# Patient Record
Sex: Female | Born: 1975 | Race: Black or African American | Hispanic: No | Marital: Married | Smoking: Never smoker
Health system: Southern US, Community
[De-identification: ages and names within clinical notes are randomized; demographics above are authoritative.]

## PROBLEM LIST (undated history)

## (undated) DIAGNOSIS — L0390 Cellulitis, unspecified: Secondary | ICD-10-CM

## (undated) DIAGNOSIS — I10 Essential (primary) hypertension: Secondary | ICD-10-CM

## (undated) DIAGNOSIS — E119 Type 2 diabetes mellitus without complications: Secondary | ICD-10-CM

## (undated) HISTORY — PX: OOPHORECTOMY: SHX86

## (undated) HISTORY — DX: Essential (primary) hypertension: I10

## (undated) HISTORY — DX: Cellulitis, unspecified: L03.90

---

## 1999-06-04 ENCOUNTER — Emergency Department (HOSPITAL_COMMUNITY): Admission: EM | Admit: 1999-06-04 | Discharge: 1999-06-04 | Payer: Self-pay | Admitting: Emergency Medicine

## 2002-12-11 HISTORY — PX: TONSILLECTOMY: SUR1361

## 2007-12-24 ENCOUNTER — Ambulatory Visit: Payer: Self-pay | Admitting: Family Medicine

## 2008-01-02 ENCOUNTER — Emergency Department: Payer: Self-pay | Admitting: Emergency Medicine

## 2009-02-06 ENCOUNTER — Ambulatory Visit: Payer: Self-pay | Admitting: Internal Medicine

## 2009-07-19 ENCOUNTER — Ambulatory Visit: Payer: Self-pay

## 2009-08-11 ENCOUNTER — Ambulatory Visit: Payer: Self-pay

## 2012-07-12 ENCOUNTER — Emergency Department: Payer: Self-pay | Admitting: *Deleted

## 2012-07-13 LAB — CBC
HCT: 40.7 % (ref 35.0–47.0)
HGB: 13.9 g/dL (ref 12.0–16.0)
MCHC: 34.2 g/dL (ref 32.0–36.0)
Platelet: 221 10*3/uL (ref 150–440)
RBC: 4.8 10*6/uL (ref 3.80–5.20)
RDW: 13.6 % (ref 11.5–14.5)
WBC: 6.2 10*3/uL (ref 3.6–11.0)

## 2012-07-13 LAB — COMPREHENSIVE METABOLIC PANEL
Albumin: 4.3 g/dL (ref 3.4–5.0)
Calcium, Total: 8.3 mg/dL — ABNORMAL LOW (ref 8.5–10.1)
Co2: 26 mmol/L (ref 21–32)
Creatinine: 0.86 mg/dL (ref 0.60–1.30)
EGFR (African American): >60
EGFR (Non-African Amer.): >60
Potassium: 4.1 mmol/L (ref 3.5–5.1)
SGOT(AST): 37 U/L (ref 15–37)
SGPT (ALT): 36 U/L (ref 12–78)

## 2013-02-24 ENCOUNTER — Ambulatory Visit: Payer: Self-pay | Admitting: Family Medicine

## 2013-03-28 ENCOUNTER — Ambulatory Visit: Payer: Self-pay | Admitting: Family Medicine

## 2014-01-19 ENCOUNTER — Emergency Department: Payer: Self-pay | Admitting: Emergency Medicine

## 2014-01-19 LAB — CBC WITH DIFFERENTIAL/PLATELET
Basophil #: 0 10*3/uL (ref 0.0–0.1)
Basophil %: 0.3 %
Eosinophil #: 0 10*3/uL (ref 0.0–0.7)
Eosinophil %: 0.4 %
HCT: 43.4 % (ref 35.0–47.0)
HGB: 14 g/dL (ref 12.0–16.0)
LYMPHS ABS: 0.4 10*3/uL — AB (ref 1.0–3.6)
Lymphocyte %: 3.3 %
MCH: 27.7 pg (ref 26.0–34.0)
MCHC: 32.2 g/dL (ref 32.0–36.0)
MCV: 86 fL (ref 80–100)
Monocyte #: 0.8 x10 3/mm (ref 0.2–0.9)
Monocyte %: 5.9 %
Neutrophil #: 12.1 10*3/uL — ABNORMAL HIGH (ref 1.4–6.5)
Neutrophil %: 90.1 %
Platelet: 233 10*3/uL (ref 150–440)
RBC: 5.04 10*6/uL (ref 3.80–5.20)
RDW: 13.7 % (ref 11.5–14.5)
WBC: 13.4 10*3/uL — AB (ref 3.6–11.0)

## 2014-01-19 LAB — URINALYSIS, COMPLETE
BLOOD: NEGATIVE
Bilirubin,UR: NEGATIVE
Glucose,UR: NEGATIVE mg/dL (ref 0–75)
KETONE: NEGATIVE
Leukocyte Esterase: NEGATIVE
NITRITE: NEGATIVE
Ph: 5 (ref 4.5–8.0)
Specific Gravity: 1.027 (ref 1.003–1.030)

## 2014-01-19 LAB — BASIC METABOLIC PANEL
Anion Gap: 9 (ref 7–16)
BUN: 11 mg/dL (ref 7–18)
CALCIUM: 8.9 mg/dL (ref 8.5–10.1)
CHLORIDE: 104 mmol/L (ref 98–107)
CREATININE: 0.95 mg/dL (ref 0.60–1.30)
Co2: 24 mmol/L (ref 21–32)
EGFR (African American): >60
EGFR (Non-African Amer.): >60
Glucose: 124 mg/dL — ABNORMAL HIGH (ref 65–99)
Osmolality: 275 (ref 275–301)
POTASSIUM: 3.8 mmol/L (ref 3.5–5.1)
Sodium: 137 mmol/L (ref 136–145)

## 2014-01-20 LAB — CBC
HCT: 38.8 % (ref 35.0–47.0)
HGB: 13 g/dL (ref 12.0–16.0)
MCH: 28.9 pg (ref 26.0–34.0)
MCHC: 33.5 g/dL (ref 32.0–36.0)
MCV: 86 fL (ref 80–100)
Platelet: 180 10*3/uL (ref 150–440)
RBC: 4.5 10*6/uL (ref 3.80–5.20)
RDW: 13.5 % (ref 11.5–14.5)
WBC: 12.2 10*3/uL — AB (ref 3.6–11.0)

## 2014-01-20 LAB — COMPREHENSIVE METABOLIC PANEL
ALBUMIN: 3.8 g/dL (ref 3.4–5.0)
ANION GAP: 6 — AB (ref 7–16)
Alkaline Phosphatase: 58 U/L
BUN: 12 mg/dL (ref 7–18)
Bilirubin,Total: 0.9 mg/dL (ref 0.2–1.0)
CHLORIDE: 100 mmol/L (ref 98–107)
Calcium, Total: 8.8 mg/dL (ref 8.5–10.1)
Co2: 28 mmol/L (ref 21–32)
Creatinine: 1.03 mg/dL (ref 0.60–1.30)
EGFR (Non-African Amer.): >60
Glucose: 131 mg/dL — ABNORMAL HIGH (ref 65–99)
Osmolality: 270 (ref 275–301)
Potassium: 3.7 mmol/L (ref 3.5–5.1)
SGOT(AST): 63 U/L — ABNORMAL HIGH (ref 15–37)
SGPT (ALT): 72 U/L (ref 12–78)
Sodium: 134 mmol/L — ABNORMAL LOW (ref 136–145)
Total Protein: 7.8 g/dL (ref 6.4–8.2)

## 2014-01-21 ENCOUNTER — Inpatient Hospital Stay: Payer: Self-pay | Admitting: Internal Medicine

## 2014-01-22 LAB — CBC WITH DIFFERENTIAL/PLATELET
BASOS ABS: 0 10*3/uL (ref 0.0–0.1)
BASOS PCT: 0.3 %
Eosinophil #: 0.3 10*3/uL (ref 0.0–0.7)
Eosinophil %: 3.5 %
HCT: 37.7 % (ref 35.0–47.0)
HGB: 12.4 g/dL (ref 12.0–16.0)
LYMPHS PCT: 20 %
Lymphocyte #: 1.9 10*3/uL (ref 1.0–3.6)
MCH: 28.4 pg (ref 26.0–34.0)
MCHC: 32.8 g/dL (ref 32.0–36.0)
MCV: 87 fL (ref 80–100)
Monocyte #: 0.9 x10 3/mm (ref 0.2–0.9)
Monocyte %: 9.8 %
Neutrophil #: 6.4 10*3/uL (ref 1.4–6.5)
Neutrophil %: 66.4 %
Platelet: 202 10*3/uL (ref 150–440)
RBC: 4.36 10*6/uL (ref 3.80–5.20)
RDW: 13.6 % (ref 11.5–14.5)
WBC: 9.6 10*3/uL (ref 3.6–11.0)

## 2014-01-22 LAB — BASIC METABOLIC PANEL
ANION GAP: 6 — AB (ref 7–16)
BUN: 6 mg/dL — AB (ref 7–18)
CALCIUM: 9 mg/dL (ref 8.5–10.1)
Chloride: 101 mmol/L (ref 98–107)
Co2: 28 mmol/L (ref 21–32)
Creatinine: 0.73 mg/dL (ref 0.60–1.30)
EGFR (Non-African Amer.): >60
Glucose: 118 mg/dL — ABNORMAL HIGH (ref 65–99)
OSMOLALITY: 269 (ref 275–301)
POTASSIUM: 3.7 mmol/L (ref 3.5–5.1)
SODIUM: 135 mmol/L — AB (ref 136–145)

## 2014-01-23 LAB — VANCOMYCIN, TROUGH: Vancomycin, Trough: 7 ug/mL — ABNORMAL LOW (ref 10–20)

## 2014-01-26 LAB — CULTURE, BLOOD (SINGLE)

## 2014-01-28 ENCOUNTER — Encounter: Payer: Self-pay | Admitting: Cardiovascular Disease

## 2014-01-28 ENCOUNTER — Ambulatory Visit (INDEPENDENT_AMBULATORY_CARE_PROVIDER_SITE_OTHER): Payer: BC Managed Care – PPO | Admitting: Cardiovascular Disease

## 2014-01-28 ENCOUNTER — Encounter: Payer: BC Managed Care – PPO | Admitting: Cardiovascular Disease

## 2014-01-28 ENCOUNTER — Encounter (INDEPENDENT_AMBULATORY_CARE_PROVIDER_SITE_OTHER): Payer: Self-pay

## 2014-01-28 VITALS — BP 150/90 | HR 87 | Ht 65.0 in | Wt 318.0 lb

## 2014-01-28 DIAGNOSIS — R7309 Other abnormal glucose: Secondary | ICD-10-CM

## 2014-01-28 DIAGNOSIS — R7303 Prediabetes: Secondary | ICD-10-CM | POA: Insufficient documentation

## 2014-01-28 DIAGNOSIS — I5031 Acute diastolic (congestive) heart failure: Secondary | ICD-10-CM

## 2014-01-28 DIAGNOSIS — I10 Essential (primary) hypertension: Secondary | ICD-10-CM | POA: Insufficient documentation

## 2014-01-28 DIAGNOSIS — I509 Heart failure, unspecified: Secondary | ICD-10-CM

## 2014-01-28 DIAGNOSIS — L0390 Cellulitis, unspecified: Secondary | ICD-10-CM | POA: Insufficient documentation

## 2014-01-28 DIAGNOSIS — R0602 Shortness of breath: Secondary | ICD-10-CM

## 2014-01-28 DIAGNOSIS — L0291 Cutaneous abscess, unspecified: Secondary | ICD-10-CM

## 2014-01-28 NOTE — Assessment & Plan Note (Signed)
Oliver sugar measurements in the hospital were more than 118. Recommended that her husband closely monitor her sugars as he is a diabetic

## 2014-01-28 NOTE — Assessment & Plan Note (Addendum)
Blood pressure was mildly elevated today. Has been provided numbers over the past week that were well controlled. Suggested she closely monitor her blood pressure at home. LVH, moderate in intensity, noted on outside echocardiogram. Possibly from chronic hypertension.

## 2014-01-28 NOTE — Progress Notes (Signed)
Patient ID: Sophia Brooks, female    DOB: 07/04/1976, 38 y.o.   MRN: [id number]  HPI Comments: Sophia Brooks is a very pleasant 38 year old woman history of moderate LVH on echocardiogram January 2015, diastolic dysfunction, presented to the hospital 01/21/2014 with discharge on 01/23/2014 with acute cellulitis of the right lower extremity, acute diastolic CHF.  She reports that 10 days ago she had right leg swelling, chills, nausea. She was seen in the emergency room and was told that she had a viral syndrome. She had worsening leg swelling and was seen back in the emergency room the next day he and was admitted for the next 4 days, started on IV antibiotics for cellulitis. Over the course of her hospitalization, she reports that she had significant IV fluids including antibiotics and by the last day she had shortness of breath when supine wheezing. She was given Lasix for discharge and Lasix to take when necessary at home.  She has not had to take additional Lasix since then and overall feels well. She continues to have right leg swelling and significant pain in the posterior aspect of her lower extremity. Left leg with no swelling and no pain. Reports that her breathing is stable, she is able to lay supine without any significant difficulty.  EKG shows normal sinus rhythm with rate 87 beats per minute with nonspecific ST abnormality in the inferior leads Blood work in the hospital shows elevated glucose levels though uncertain if these were fasting, normal renal function Ultrasound of her leg ever 11 2015 showing no DVT All of her glucose levels in the hospital ranged between 118, 124, 131, up to 167   Outpatient Encounter Prescriptions as of 01/28/2014  Medication Sig  . albuterol (ACCUNEB) 1.25 MG/3ML nebulizer solution Take 1 ampule by nebulization every 6 (six) hours as needed for wheezing.  . Cephalexin 500 MG tablet Take 500 mg by mouth 4 (four) times daily.  . chlorpheniramine-HYDROcodone  (TUSSIONEX) 10-8 MG/5ML LQCR Take 5 mLs by mouth 2 (two) times daily.  . furosemide (LASIX) 20 MG tablet Take 20 mg by mouth daily as needed.  Marland Kitchen ibuprofen (ADVIL,MOTRIN) 800 MG tablet Take 800 mg by mouth every 6 (six) hours as needed.  . metoprolol succinate (TOPROL-XL) 50 MG 24 hr tablet Take 50 mg by mouth daily. Take with or immediately following a meal.  . [DISCONTINUED] fexofenadine (ALLEGRA) 60 MG tablet Take 60 mg by mouth 2 (two) times daily.     Review of Systems  Constitutional: Negative.   HENT: Negative.   Eyes: Negative.   Respiratory: Negative.   Cardiovascular: Negative.   Gastrointestinal: Negative.   Endocrine: Negative.   Musculoskeletal: Negative.   Skin: Negative.        Leg swelling and pain on the right  Allergic/Immunologic: Negative.   Neurological: Negative.   Hematological: Negative.   Psychiatric/Behavioral: Negative.   All other systems reviewed and are negative.   BP 150/90  Pulse 87  Ht 5\' 5"  (1.651 m)  Wt 318 lb (144.244 kg)  BMI 52.92 kg/m2  Physical Exam  Nursing note and vitals reviewed. Constitutional: She is oriented to person, place, and time. She appears well-developed and well-nourished.  Obese  HENT:  Head: Normocephalic.  Nose: Nose normal.  Mouth/Throat: Oropharynx is clear and moist.  Eyes: Conjunctivae are normal. Pupils are equal, round, and reactive to light.  Neck: Normal range of motion. Neck supple. No JVD present.  Cardiovascular: Normal rate, regular rhythm, S1 normal, S2 normal, normal heart  sounds and intact distal pulses.  Exam reveals no gallop and no friction rub.   No murmur heard. Pulmonary/Chest: Effort normal and breath sounds normal. No respiratory distress. She has no wheezes. She has no rales. She exhibits no tenderness.  Abdominal: Soft. Bowel sounds are normal. She exhibits no distension. There is no tenderness.  Musculoskeletal: Normal range of motion. She exhibits no edema and no tenderness.   Lymphadenopathy:    She has no cervical adenopathy.  Neurological: She is alert and oriented to person, place, and time. Coordination normal.  Skin: Skin is warm and dry. No rash noted. No erythema.  Right leg is tender to palpation particularly below the knee, region of erythematous swelling of the right calf, 2 areas of blister  Psychiatric: She has a normal mood and affect. Her behavior is normal. Judgment and thought content normal.    Assessment and Plan

## 2014-01-28 NOTE — Assessment & Plan Note (Signed)
Suspect her exacerbation of diastolic CHF was from significant IV fluids while she was recently in the hospital. Suggested she take Lasix one or 2 tabs as needed for weight gain, shortness of breath or leg swelling. Discussed LVH with her. Recommended strict blood pressure control

## 2014-01-28 NOTE — Assessment & Plan Note (Signed)
Shortness of breath improved. Recent symptoms of shortness of breath likely from IV fluids in the hospital. Improved with Lasix

## 2014-01-28 NOTE — Assessment & Plan Note (Signed)
Cellulitis appears to be improving. Bandage placed today remained stability of her weeping blisters

## 2014-01-28 NOTE — Patient Instructions (Signed)
You are doing well. No medication changes were made.  Take lasix one tor two pills as needed for shortness of breath, quick weight gain for fluid, leg swelling/ankle edema Take with potassium  Please call us if you have new issues that need to be addressed before your next appt.  Follow up in a  Year

## 2014-03-02 ENCOUNTER — Ambulatory Visit (INDEPENDENT_AMBULATORY_CARE_PROVIDER_SITE_OTHER): Payer: BC Managed Care – PPO | Admitting: Podiatry

## 2014-03-02 ENCOUNTER — Encounter: Payer: Self-pay | Admitting: Podiatry

## 2014-03-02 ENCOUNTER — Ambulatory Visit (INDEPENDENT_AMBULATORY_CARE_PROVIDER_SITE_OTHER): Payer: BC Managed Care – PPO

## 2014-03-02 VITALS — BP 74/49 | HR 75 | Resp 16 | Ht 66.0 in | Wt 316.0 lb

## 2014-03-02 DIAGNOSIS — M79673 Pain in unspecified foot: Secondary | ICD-10-CM

## 2014-03-02 DIAGNOSIS — L02619 Cutaneous abscess of unspecified foot: Secondary | ICD-10-CM

## 2014-03-02 DIAGNOSIS — M79609 Pain in unspecified limb: Secondary | ICD-10-CM

## 2014-03-02 DIAGNOSIS — L03039 Cellulitis of unspecified toe: Secondary | ICD-10-CM

## 2014-03-02 MED ORDER — CEPHALEXIN 500 MG PO CAPS: 500.0000 mg | ORAL_CAPSULE | Freq: Three times a day (TID) | ORAL | Status: DC

## 2014-03-02 NOTE — Progress Notes (Signed)
   Subjective:    Patient ID: Sophia Brooks, female    DOB: 20-Nov-1976, 38 y.o.   MRN: [id number]  HPI Comments: i have a blister on my pinki toe on my left foot. Ive had it for off and on since 2.7.15. The toe is painful.  i was putting on lotion and a piece of skin came off and it had an odor to it. i went to the hospital the week of 2.9.15 and i was treated for cellulitis. They treated me with IV antibiotics. Dr Pryor Curiagrandas said i needed foot powder for foot fungus. She gave a rx foot powder but the pharmacy never got it in so i used lamisil foot cream in between my 4th and 5th toe rt foot.   My big toenail on my left foot has a black mark on it. Its been there off and on for 8-9 months. i used the lamisil cream on it. i trim my toenails. i have slight pain at times that goes through the nail.  Foot Pain      Review of Systems  Musculoskeletal:       Calf pain  Skin:       Change in nails  All other systems reviewed and are negative.       Objective:   Physical Exam: I have reviewed her past mental history medications allergies surgeries and social history. Has recently had a bout of cellulitis and was hospitalized. Pulses are strongly palpable right lower extremity. With a small blister beneath the fifth digit of the right foot mild cellulitis extending around the blister. Radiographs do not demonstrate any type of osseous abnormalities.        Assessment & Plan:  Assessment: Abscess with cellulitis fifth digit right foot.  Plan: We performed an incision and drainage today after Betadine skin prep. The drainage was sampled for a culture and sensitivity. We discussed with her soaking on her regular basis. I also discussed with her antibiotic use which for which we prescribed Keflex 500 mg 3 times a day. I will followup with her in a couple of weeks should the sample come back and we need for by different antibiotic we will do so.

## 2014-03-09 ENCOUNTER — Encounter: Payer: Self-pay | Admitting: Podiatry

## 2014-03-18 ENCOUNTER — Ambulatory Visit (INDEPENDENT_AMBULATORY_CARE_PROVIDER_SITE_OTHER): Payer: BC Managed Care – PPO | Admitting: Podiatry

## 2014-03-18 ENCOUNTER — Encounter: Payer: Self-pay | Admitting: Podiatry

## 2014-03-18 VITALS — BP 140/87 | HR 79 | Resp 18

## 2014-03-18 DIAGNOSIS — L02619 Cutaneous abscess of unspecified foot: Secondary | ICD-10-CM

## 2014-03-18 DIAGNOSIS — L03039 Cellulitis of unspecified toe: Principal | ICD-10-CM

## 2014-03-18 NOTE — Progress Notes (Signed)
She presents today for followup of an abscess to the plantar aspect of the fifth digit of the right foot. There is no erythema edema saline is drainage or odor on physical exam today appears to be healing quite nicely whenever I asked her she been soaking she states she soak twice a saw her last and she still taking antibiotics which should be completed this point.  Assessment: Well-healing I&D plantar aspect fifth digit right foot.  Plan continue to soak for week and continue to finish her antibiotics.

## 2015-04-03 NOTE — Discharge Summary (Signed)
PATIENT NAME:  Sophia Brooks, Sophia Brooks MR#:  [id number] DATE OF BIRTH:  [birth year]  DATE OF ADMISSION:  01/21/2014 DATE OF DISCHARGE:  01/23/2014  DISCHARGE DIAGNOSES: 1.  Acute cellulitis of the right lower extremity, improving with antibiotics.  2.  Lower extremity swelling, possible acute diastolic heart failure, well compensated now.   SECONDARY DIAGNOSES: Hypertension and obesity.   CONSULTATIONS: None.   PROCEDURES AND RADIOLOGY: Right lower extremity Doppler on 11th of February showed no DVT.   Chest x-ray on 12th of February showed bronchitic changes. Right middle lobe atelectasis. No pneumonia.   MAJOR LABORATORY PANEL: Blood cultures x 2 were negative on 11th of February.   HISTORY AND SHORT HOSPITAL COURSE: The patient is a 39 year old female with the above-mentioned medical problems who was admitted for right lower extremity cellulitis, was started on IV antibiotic. Please see Dr. Rob Hickman dictated history and physical for further details. This was broadened with addition of vancomycin to get better coverage of skin organism. The patient was improving. She was found to have some lower extremity swelling, for which she was given a one-time dose of Lasix with some improvement and was recommended use of p.r.n. Lasix at home and outpatient cardiology followup for evaluation of possible diastolic heart failure considering her obesity. On 13th of February, her leg swelling was much reduced and she was feeling much better and was discharged home in stable condition. On the 13th of February, her leg swelling and redness was significantly improved and was feeling much better, was close to her baseline, was discharged home in stable condition.   PHYSICAL EXAMINATION: VITAL SIGNS: On the date of discharge were as follows: Temperature 98, heart rate 82 per minute, respirations 18 per minute, blood pressure 139/83 mmHg. She was saturating 93% on room air.  CARDIOVASCULAR: S1, S2 normal. No murmurs,  rubs or gallop.  LUNGS: Clear to auscultation bilaterally. No wheezing, rales, rhonchi or crepitation.  ABDOMEN: Soft, benign.  NEUROLOGIC: Nonfocal examination. SKIN: On the right lower extremity was not showing much erythema. No warmth. Minimal swelling.   All other physical examination remained at baseline.   DISCHARGE MEDICATIONS: 1.  Metoprolol 50 mg p.o. daily.  2.  Albuterol nebulizer 3 times a day as needed.  3.  Tussionex 5 mL twice a day.  4.  Ibuprofen 800 mg p.o. 3 times a day.  5.  Fexofenadine/pseudoephedrine 1 tablet twice a day.  6.  Lasix 20 mg p.o. daily as needed.  7.  Keflex 500 mg 4 times a day for 5 more days.   DISCHARGE DIET: Low-sodium.   DISCHARGE ACTIVITY: As tolerated.   DISCHARGE INSTRUCTIONS AND FOLLOWUP: The patient was instructed to follow up with her primary care physician, Dr. Rolm Gala, in 1 to 2 weeks. She will need followup with cardiology, Dr. Mariah Milling, in 2 to 4 weeks; pulmonary, Dr. Meredeth Ide, in 4 to 6 weeks; and [facility], Dr. Gwen Pounds, in 2 to 4 weeks for evaluation of possible sarcoidosis, as the patient's mother was really concerned considering mother's history of sarcoidosis. I have recommended both mother and patient that this is outpatient workup and they are in agreement, and she is being discharged home in stable condition.   TOTAL TIME DISCHARGING THIS PATIENT: 55 minutes.   ____________________________ Ellamae Sia. Sherryll Burger, MD vss:jcm D: 01/24/2014 11:39:45 ET T: 01/24/2014 13:30:27 ET JOB#: [id number]  cc: Damondre Pfeifle S. Sherryll Burger, MD, <Dictator> Letitia Caul, MD Antonieta Iba, MD Herbon E. Meredeth Ide, MD Dr. Gwen Pounds, [facility] Ewel Lona Kathie Rhodes [facility]  MD ELECTRONICALLY SIGNED 01/25/2014 17:15

## 2015-04-03 NOTE — H&P (Signed)
PATIENT NAME:  Sophia Brooks, Sophia Brooks MR#:  [id number] DATE OF BIRTH:  [birth year]  DATE OF ADMISSION:  01/21/2014  PRIMARY CARE PHYSICIAN:  Dr. Rolm Gala.  REFERRING PHYSICIAN:  Dr. Manson Passey.  CHIEF COMPLAINT:  Redness of the right lower extremity.   HISTORY OF PRESENT ILLNESS:  The patient is a 39 year old morbidly obese female with past medical history of hypertension, is presenting to the ER with a chief complaint of redness in the right lower extremity.  She is reporting that while applying moisturizer on her right lower extremity two days ago she noticed a small cut on the flexor aspect of the right fifth toe.  Eventually, she noticed redness in the right thigh area which was spreading to the right lower extremity.  Actually she came to the ER on Monday.  She was diagnosed with URI which was treated with Tussionex, ibuprofen and Allegra.  The patient spiked a temp of 101.9 degrees Fahrenheit yesterday.  Denies any shortness of breath or dyspnea.  As the patient's redness is getting worse and extended to the right lower extremity she has called ER yesterday and they advised her to come to the ER.  Right lower extremity venous Dopplers were ordered in the ER.  According to the preliminary report, there is no DVT.  The patient was given IV clindamycin and hospitalist team is called to admit the patient.  During my examination, the patient is resting comfortably.  Husband is at bedside.  Denies any similar complaints in the past.  No chest pain, shortness of breath.  No other complaints.  The patient denies any wounds or abrasions, tick bites or bug bites recently on the lower extremity.  PAST MEDICAL HISTORY:  Hypertension and obesity.   PAST SURGICAL HISTORY:  Tonsillectomy.   PSYCHOSOCIAL HISTORY:  Lives at home with husband.  No history of smoking, alcohol or illicit drug usage.   ALLERGIES:  SHE IS ALLERGIC TO SULFA, TESSALON PERLES.   HOME MEDICATIONS:  Metoprolol extended release 50 mg 1  tablet by mouth once daily, ibuprofen 800 mg by mouth 3 times a day, fexofenadine 1 tablet by mouth q. 12 hours, albuterol inhaler as needed for shortness of breath, Tussionex 5 mL by mouth every 12 hours.   FAMILY HISTORY:  Mother has history of hypertension.  REVIEW OF SYSTEMS:  CONSTITUTIONAL:  Denies fatigue, but she spiked a temperature of 101 degrees yesterday.  EYES:  Denies blurry vision, double vision.  EARS, NOSE, THROAT:  Denies epistaxis, discharge.  RESPIRATION:  Denies cough, COPD.  CARDIOVASCULAR:  No chest pain, palpitations.  GASTROINTESTINAL:  Denies nausea, vomiting, diarrhea.  GENITOURINARY:  No dysuria, hematuria.  GYNECOLOGIC AND BREAST:  Denies breast mass or vaginal discharge.  ENDOCRINE:  Denies polyuria, nocturia, thyroid problems.  Has history of hypertension.  HEMATOLOGIC AND LYMPHATIC:  No anemia, easy bruising, bleeding.  INTEGUMENTARY:  No acne, rash, lesions.  MUSCULOSKELETAL:  No joint pain in the neck and back.  Denies gout.  NEUROLOGIC:  No vertigo or ataxia.  PSYCHIATRIC:  No ADD, OCD.   PHYSICAL EXAMINATION: VITAL SIGNS:  Temperature 97.6, pulse 96, respirations 18, blood pressure 139/68, pulse ox 95% on room air.  GENERAL APPEARANCE:  Not under acute distress.  Morbidly obese.  HEENT:  Normocephalic, atraumatic.  Pupils are equally reacting to light and accommodation.  No scleral icterus.  No conjunctival injection.  No sinus tenderness.  No postnasal drip.  Oral cavity is intact.  Uvula is midline.  NECK:  Supple.  No  JVD.  No thyromegaly.  Trachea is midline.  Range of motion is intact.  LUNGS:  Clear to auscultation bilaterally.  No accessory muscle usage.  No anterior chest wall tenderness on palpation.  CARDIAC:  S1, S2 normal.  Regular rate and rhythm.  No murmurs.  GASTROINTESTINAL:  Soft.  Bowel sounds are positive in all four quadrants.  Nontender, nondistended.  No hepatosplenomegaly.  No masses felt.  NEUROLOGIC:  Awake, alert, oriented x  3.  Motor and sensory grossly intact.  Reflexes are 2+.  EXTREMITIES:  Right lower extremity is erythematous in the right medial aspect of the thigh and the streaks extending down the right lower extremity which is significantly erythematous, tender and edematous.  Homans sign is positive.  Left lower extremity is intact.  A small teeny skin break was noticed on the flexor aspect of the right fifth toe.  No discharge noticed.  Peripheral pulse are 2+.  MUSCULOSKELETAL:  No joint effusion, tenderness.  PSYCHIATRIC:  Normal mood and affect.   LABORATORY AND IMAGING STUDIES:  Right lower extremity ultrasound, preliminary report, no DVT.  Chem-8 is normal except glucose at 131.  LFTs are normal except AST which is elevated .  CBC is normal except WBC is elevated at 12.2.    ASSESSMENT AND PLAN:  A 39 year old obese female presenting to the ER with a chief complaint of redness of the right lower extremity.  1.  Acute cellulitis of the right lower extremity.  We will provider her IV Rocephin and monitor clinically.  2.  Hypertension.  We will resume her home medication metoprolol.  3.  History of obesity.  The patient will be benefited with a low fat diet and exercise.   4.  We will provide her gastrointestinal and deep vein thrombosis prophylaxis.   5.  The rounding physician to follow up on the official report of right lower extremity ultrasound in the a.m.   6.  CODE STATUS:  SHE IS FULL CODE.   Diagnosis and plan of care was discussed in detail with the patient and her husband at bedside.  They both verbalized understanding of the plan.   Total time spent on the admission is 45 minutes.      ____________________________ Ramonita LabAruna Riese Hellard, MD ag:ea D: 01/21/2014 04:31:00 ET T: 01/21/2014 05:02:43 ET JOB#: [id number]  cc: Ramonita LabAruna Oma Marzan, MD, <Dictator> Letitia CaulHeidi M. Grandis, MD Ramonita LabARUNA Flois Mctague MD ELECTRONICALLY SIGNED 02/05/2014 18:39

## 2017-07-02 ENCOUNTER — Ambulatory Visit: Payer: BLUE CROSS/BLUE SHIELD

## 2017-07-02 ENCOUNTER — Encounter: Payer: BC Managed Care – PPO | Admitting: Podiatry

## 2017-07-02 DIAGNOSIS — M722 Plantar fascial fibromatosis: Secondary | ICD-10-CM

## 2017-07-02 NOTE — Progress Notes (Signed)
This encounter was created in error - please disregard.

## 2017-07-25 ENCOUNTER — Ambulatory Visit: Payer: BLUE CROSS/BLUE SHIELD

## 2017-07-25 ENCOUNTER — Emergency Department: Payer: BLUE CROSS/BLUE SHIELD

## 2017-07-25 ENCOUNTER — Encounter: Payer: Self-pay | Admitting: *Deleted

## 2017-07-25 ENCOUNTER — Emergency Department
Admission: EM | Admit: 2017-07-25 | Discharge: 2017-07-25 | Disposition: A | Discharge status: Home / Self Care | Payer: BLUE CROSS/BLUE SHIELD | Attending: Student in an Organized Health Care Education/Training Program | Admitting: Student in an Organized Health Care Education/Training Program

## 2017-07-25 DIAGNOSIS — R0602 Shortness of breath: Secondary | ICD-10-CM

## 2017-07-25 DIAGNOSIS — R1031 Right lower quadrant pain: Secondary | ICD-10-CM | POA: Diagnosis not present

## 2017-07-25 DIAGNOSIS — I5042 Chronic combined systolic (congestive) and diastolic (congestive) heart failure: Secondary | ICD-10-CM | POA: Insufficient documentation

## 2017-07-25 DIAGNOSIS — R911 Solitary pulmonary nodule: Secondary | ICD-10-CM

## 2017-07-25 DIAGNOSIS — I10 Essential (primary) hypertension: Secondary | ICD-10-CM | POA: Insufficient documentation

## 2017-07-25 DIAGNOSIS — I110 Hypertensive heart disease with heart failure: Secondary | ICD-10-CM | POA: Diagnosis not present

## 2017-07-25 LAB — COMPREHENSIVE METABOLIC PANEL
ALT: 24 U/L (ref 14–54)
AST: 33 U/L (ref 15–41)
Albumin: 4.9 g/dL (ref 3.5–5.0)
Alkaline Phosphatase: 35 U/L — ABNORMAL LOW (ref 38–126)
Anion gap: 10 (ref 5–15)
BUN: 11 mg/dL (ref 6–20)
CHLORIDE: 104 mmol/L (ref 101–111)
CO2: 24 mmol/L (ref 22–32)
Calcium: 9.2 mg/dL (ref 8.9–10.3)
Creatinine, Ser: 0.7 mg/dL (ref 0.44–1.00)
Glucose, Bld: 130 mg/dL — ABNORMAL HIGH (ref 65–99)
POTASSIUM: 3.7 mmol/L (ref 3.5–5.1)
SODIUM: 138 mmol/L (ref 135–145)
Total Bilirubin: 1.6 mg/dL — ABNORMAL HIGH (ref 0.3–1.2)
Total Protein: 8 g/dL (ref 6.5–8.1)

## 2017-07-25 LAB — URINALYSIS, COMPLETE (UACMP) WITH MICROSCOPIC
BACTERIA UA: NONE SEEN
BILIRUBIN URINE: NEGATIVE
Glucose, UA: NEGATIVE mg/dL
KETONES UR: NEGATIVE mg/dL
LEUKOCYTES UA: NEGATIVE
Nitrite: NEGATIVE
PROTEIN: NEGATIVE mg/dL
SPECIFIC GRAVITY, URINE: 1.02 (ref 1.005–1.030)
pH: 5 (ref 5.0–8.0)

## 2017-07-25 LAB — FIBRIN DERIVATIVES D-DIMER (ARMC ONLY): Fibrin derivatives D-dimer (ARMC): 603.64 — ABNORMAL HIGH (ref 0.00–499.00)

## 2017-07-25 LAB — CBC
HEMATOCRIT: 36.7 % (ref 35.0–47.0)
HEMOGLOBIN: 12.4 g/dL (ref 12.0–16.0)
MCH: 28.2 pg (ref 26.0–34.0)
MCHC: 33.9 g/dL (ref 32.0–36.0)
MCV: 83.3 fL (ref 80.0–100.0)
Platelets: 249 10*3/uL (ref 150–440)
RBC: 4.41 MIL/uL (ref 3.80–5.20)
RDW: 14.1 % (ref 11.5–14.5)
WBC: 7.1 10*3/uL (ref 3.6–11.0)

## 2017-07-25 LAB — TROPONIN I: Troponin I: <0.03 ng/mL (ref <0.03)

## 2017-07-25 LAB — LIPASE, BLOOD: LIPASE: 23 U/L (ref 11–51)

## 2017-07-25 LAB — POCT PREGNANCY, URINE: Preg Test, Ur: NEGATIVE

## 2017-07-25 MED ORDER — IOPAMIDOL (ISOVUE-300) INJECTION 61%
150.0000 mL | Freq: Once | INTRAVENOUS | Status: AC | PRN
  Administered 2017-07-25: 150 mL via INTRAVENOUS

## 2017-07-25 MED ORDER — SODIUM CHLORIDE 0.9 % IV BOLUS (SEPSIS)
1000.0000 mL | Freq: Once | INTRAVENOUS | Status: AC
  Administered 2017-07-25: 1000 mL via INTRAVENOUS

## 2017-07-25 MED ORDER — IPRATROPIUM-ALBUTEROL 0.5-2.5 (3) MG/3ML IN SOLN
3.0000 mL | Freq: Once | RESPIRATORY_TRACT | Status: AC
  Administered 2017-07-25: 3 mL via RESPIRATORY_TRACT
  Filled 2017-07-25: qty 3

## 2017-07-25 MED ORDER — PROMETHAZINE HCL 12.5 MG PO TABS: 12.5000 mg | ORAL_TABLET | Freq: Four times a day (QID) | ORAL | 0 refills | Status: DC | PRN

## 2017-07-25 MED ORDER — HYDROCODONE-ACETAMINOPHEN 5-325 MG PO TABS
2.0000 | ORAL_TABLET | Freq: Once | ORAL | Status: AC
  Administered 2017-07-25: 2 via ORAL
  Filled 2017-07-25: qty 2

## 2017-07-25 MED ORDER — MORPHINE SULFATE (PF) 4 MG/ML IV SOLN
4.0000 mg | INTRAVENOUS | Status: DC | PRN
  Administered 2017-07-25: 4 mg via INTRAVENOUS
  Filled 2017-07-25: qty 1

## 2017-07-25 MED ORDER — LABETALOL HCL 5 MG/ML IV SOLN
5.0000 mg | Freq: Once | INTRAVENOUS | Status: AC
  Administered 2017-07-25: 5 mg via INTRAVENOUS
  Filled 2017-07-25: qty 4

## 2017-07-25 MED ORDER — HYDROCODONE-ACETAMINOPHEN 5-325 MG PO TABS: 1.0000 | ORAL_TABLET | ORAL | 0 refills | Status: DC | PRN

## 2017-07-25 MED ORDER — PROMETHAZINE HCL 25 MG/ML IJ SOLN
12.5000 mg | Freq: Four times a day (QID) | INTRAMUSCULAR | Status: DC | PRN
  Administered 2017-07-25: 12.5 mg via INTRAVENOUS
  Filled 2017-07-25: qty 1

## 2017-07-25 MED ORDER — IOPAMIDOL (ISOVUE-370) INJECTION 76%
75.0000 mL | Freq: Once | INTRAVENOUS | Status: AC | PRN
  Administered 2017-07-25: 75 mL via INTRAVENOUS

## 2017-07-25 NOTE — ED Notes (Signed)
NM tech at bedside to explain VQ scan. Pt states she can not lay flat for the scan due to shortness of breath and will not be able to do the test. Dr. Pershing ProudSchaevitz notified.

## 2017-07-25 NOTE — ED Provider Notes (Signed)
[facility] Emergency Department Provider Note    First MD Initiated Contact with Patient 07/25/17 1141     (approximate)  I have reviewed the triage vital signs and the nursing notes.   HISTORY  Chief Complaint Abdominal Pain and Shortness of Breath    HPI Sophia Brooks is a 41 y.o. female is a chief complaint of 2 days of what initially was right lower quadrant abdominal pain now in the epigastric area associated with pressure. But denies any history of constipation. No fevers. Has felt nauseated but no vomiting. States that she felt SOB this am but the shortness of breath did not start until after having worsening abdominal pain. Denies any cough. No flank pain. No dysuria. No vaginal discharge. No recent abdominal surgeries.NO chest pain, diaphoresis, or exertional symptoms.    Past Medical History:  Diagnosis Date  . Cellulitis    acute cellulitis of the right lower extremity  . Hypertension    Family History  Problem Relation Age of Onset  . Hypertension Mother   . Heart attack Father 25  . Heart disease Father        3 stents placed LCA  . Hypertension Sister   . Heart attack Paternal Grandmother 52   Past Surgical History:  Procedure Laterality Date  . TONSILLECTOMY  2004   Patient Active Problem List   Diagnosis Date Noted  . HTN (hypertension) 01/28/2014  . Borderline diabetes 01/28/2014  . Cellulitis 01/28/2014  . Acute diastolic CHF (congestive heart failure) (HCC) 01/28/2014  . Shortness of breath 01/28/2014      Prior to Admission medications   Medication Sig Start Date End Date Taking? Authorizing Provider  meloxicam (MOBIC) 15 MG tablet Take 15 mg by mouth daily.   Yes [provider]  albuterol (ACCUNEB) 1.25 MG/3ML nebulizer solution Take 1 ampule by nebulization every 6 (six) hours as needed for wheezing.    [provider]  cephALEXin (KEFLEX) 500 MG capsule Take 1 capsule (500 mg total) by  mouth 3 (three) times daily. 03/02/14   Hyatt, Max T, DPM  furosemide (LASIX) 20 MG tablet Take 20 mg by mouth daily as needed.    [provider]  HYDROcodone-acetaminophen (NORCO) 5-325 MG tablet Take 1 tablet by mouth every 4 (four) hours as needed for moderate pain. 07/25/17   Willy Eddy, MD  metoprolol succinate (TOPROL-XL) 50 MG 24 hr tablet Take 50 mg by mouth daily. Take with or immediately following a meal.    [provider]  promethazine (PHENERGAN) 12.5 MG tablet Take 1 tablet (12.5 mg total) by mouth every 6 (six) hours as needed for nausea or vomiting. 07/25/17   Willy Eddy, MD    Allergies Sulfa antibiotics and Tessalon [benzonatate]    Social History Social History  Substance Use Topics  . Smoking status: Never Smoker  . Smokeless tobacco: Never Used  . Alcohol use No    Review of Systems Patient denies headaches, rhinorrhea, blurry vision, numbness, shortness of breath, chest pain, edema, cough, abdominal pain, nausea, vomiting, diarrhea, dysuria, fevers, rashes or hallucinations unless otherwise stated above in HPI. ____________________________________________   PHYSICAL EXAM:  VITAL SIGNS: Vitals:   07/25/17 1304 07/25/17 1620  BP: (!) 187/103 (!) 187/104  Pulse:  98  Resp:    Temp:    SpO2:  97%    Constitutional: Alert and oriented. Well appearing and in no acute distress. Eyes: Conjunctivae are normal.  Head: Atraumatic. Nose: No congestion/rhinnorhea. Mouth/Throat:  Mucous membranes are moist.   Neck: No stridor. Painless ROM.  Cardiovascular: Normal rate, regular rhythm. Grossly normal heart sounds.  Good peripheral circulation. Respiratory: Normal respiratory effort.  No retractions. Lungs CTAB. Gastrointestinal: Soft with mild epigastric, ruq and rlq ttp.  No guarding or rebound. No distention. No abdominal bruits. No CVA tenderness. Genitourinary:  Musculoskeletal: No lower extremity tenderness nor edema.  No joint  effusions. Neurologic:  Normal speech and language. No gross focal neurologic deficits are appreciated. No facial droop Skin:  Skin is warm, dry and intact. No rash noted. Psychiatric: Mood and affect are normal. Speech and behavior are normal.  ____________________________________________   LABS (all labs ordered are listed, but only abnormal results are displayed)  Results for orders placed or performed during the hospital encounter of 07/25/17 (from the past 24 hour(s))  Lipase, blood     Status: None   Collection Time: 07/25/17 11:33 AM  Result Value Ref Range   Lipase 23 11 - 51 U/L  Comprehensive metabolic panel     Status: Abnormal   Collection Time: 07/25/17 11:33 AM  Result Value Ref Range   Sodium 138 135 - 145 mmol/L   Potassium 3.7 3.5 - 5.1 mmol/L   Chloride 104 101 - 111 mmol/L   CO2 24 22 - 32 mmol/L   Glucose, Bld 130 (H) 65 - 99 mg/dL   BUN 11 6 - 20 mg/dL   Creatinine, Ser 1.61 0.44 - 1.00 mg/dL   Calcium 9.2 8.9 - 09.6 mg/dL   Total Protein 8.0 6.5 - 8.1 g/dL   Albumin 4.9 3.5 - 5.0 g/dL   AST 33 15 - 41 U/L   ALT 24 14 - 54 U/L   Alkaline Phosphatase 35 (L) 38 - 126 U/L   Total Bilirubin 1.6 (H) 0.3 - 1.2 mg/dL   GFR calc non Af Amer >60 >60 mL/min   GFR calc Af Amer >60 >60 mL/min   Anion gap 10 5 - 15  CBC     Status: None   Collection Time: 07/25/17 11:33 AM  Result Value Ref Range   WBC 7.1 3.6 - 11.0 K/uL   RBC 4.41 3.80 - 5.20 MIL/uL   Hemoglobin 12.4 12.0 - 16.0 g/dL   HCT 04.5 40.9 - 81.1 %   MCV 83.3 80.0 - 100.0 fL   MCH 28.2 26.0 - 34.0 pg   MCHC 33.9 32.0 - 36.0 g/dL   RDW 91.4 78.2 - 95.6 %   Platelets 249 150 - 440 K/uL  Troponin I     Status: None   Collection Time: 07/25/17 11:33 AM  Result Value Ref Range   Troponin I <0.03 <0.03 ng/mL  Urinalysis, Complete w Microscopic     Status: Abnormal   Collection Time: 07/25/17  1:05 PM  Result Value Ref Range   Color, Urine YELLOW (A) YELLOW   APPearance HAZY (A) CLEAR   Specific  Gravity, Urine 1.020 1.005 - 1.030   pH 5.0 5.0 - 8.0   Glucose, UA NEGATIVE NEGATIVE mg/dL   Hgb urine dipstick SMALL (A) NEGATIVE   Bilirubin Urine NEGATIVE NEGATIVE   Ketones, ur NEGATIVE NEGATIVE mg/dL   Protein, ur NEGATIVE NEGATIVE mg/dL   Nitrite NEGATIVE NEGATIVE   Leukocytes, UA NEGATIVE NEGATIVE   RBC / HPF 0-5 0 - 5 RBC/hpf   WBC, UA 0-5 0 - 5 WBC/hpf   Bacteria, UA NONE SEEN NONE SEEN   Squamous Epithelial / LPF 0-5 (A) NONE SEEN   Mucous  PRESENT    Hyaline Casts, UA PRESENT   Pregnancy, urine POC     Status: None   Collection Time: 07/25/17  1:09 PM  Result Value Ref Range   Preg Test, Ur NEGATIVE NEGATIVE   ____________________________________________  EKG My review and personal interpretation at Time: 11:27   Indication: sob  Rate: 100  Rhythm: sinus Axis: normal Other: minimal ST depression II and V6, no STEMI, normal intervals  My review and personal interpretation at Time: 16:20   Indication: sob  Rate: 95  Rhythm: sinus Axis: normal Other: minimal ST depression II and V6, no STEMI, normal intervals, no dynamic changes as compared to previous ____________________________________________  RADIOLOGY  I personally reviewed all radiographic images ordered to evaluate for the above acute complaints and reviewed radiology reports and findings.  These findings were personally discussed with the patient.  Please see medical record for radiology report.  ____________________________________________   PROCEDURES  Procedure(s) performed:  Procedures    Critical Care performed: no ____________________________________________   INITIAL IMPRESSION / ASSESSMENT AND PLAN / ED COURSE  Pertinent labs & imaging results that were available during my care of the patient were reviewed by me and considered in my medical decision making (see chart for details).  DDX: appy, cyst, diverticulitis, colitis, abscess, toa, mass, obstruction, pna, bronchitis, pe  Leonor LivMartarash  M Stash is a 41 y.o. who presents to the ED with Abdominal pain as described above. Patient also complaining of shortness of breath. Will order CT abd for the above differential  No chest pain. Has nonspecific ST changes but her troponin is negative.  Heart score of 3. Will further risk stratify with repeat troponin. Patient is low risk by wells criteria.    The patient will be placed on continuous pulse oximetry and telemetry for monitoring.  Laboratory evaluation will be sent to evaluate for the above complaints.     Clinical Course as of Jul 26 1627  Wed Jul 25, 2017  1407 Results of CT imaging discussed with patient. We'll order a transvaginal ultrasound to further evaluate for ovarian pathology.  Based on her lack of a leukocytosis or fever feel this is less consistent with SBP or acute infectious process. Less consistent with diverticulitis or perforation.  [PR]    Clinical Course User Index [PR] Willy Eddyobinson, Debroah Shuttleworth, MD   Based on CT imaging with an ovarian mass it occurred mid the patient's heart rate on EKG was greater than 100 and was therefore not PERC negative.  D-dimer will be sent to further risk stratify as well as VQ scan.  Remains chest pain free at this time.  Remains hemodynamically stable.  Patient will be signed out to Dr. Pershing ProudSchaevitz pending reassessment.  ____________________________________________   FINAL CLINICAL IMPRESSION(S) / ED DIAGNOSES  Final diagnoses:  RLQ abdominal pain  Shortness of breath      NEW MEDICATIONS STARTED DURING THIS VISIT:  New Prescriptions   HYDROCODONE-ACETAMINOPHEN (NORCO) 5-325 MG TABLET    Take 1 tablet by mouth every 4 (four) hours as needed for moderate pain.   PROMETHAZINE (PHENERGAN) 12.5 MG TABLET    Take 1 tablet (12.5 mg total) by mouth every 6 (six) hours as needed for nausea or vomiting.     Note:  This document was prepared using Dragon voice recognition software and may include unintentional dictation errors.      Willy Eddyobinson, Aladdin Kollmann, MD 07/25/17 [phone]

## 2017-07-25 NOTE — Progress Notes (Signed)
While rounding unit CH visited pt. Pt was lying in bed and a family member at bedside. CH talked briefly to pt and family member about how the pt was doing. Pt stated she was not feeling well CH empathized pt's pain, provided a ministry of presence, and [facility].   07/25/17 1300  Clinical Encounter Type  Visited With Patient and family together  Visit Type Initial;Other (Comment)  Referral From Chaplain  Spiritual Encounters  Spiritual Needs Prayer;Other (Comment)

## 2017-07-25 NOTE — ED Notes (Signed)
Patient returned from CT

## 2017-07-25 NOTE — ED Notes (Signed)
Patient transported to Ultrasound 

## 2017-07-25 NOTE — ED Notes (Signed)
Pt returned from US

## 2017-07-25 NOTE — ED Provider Notes (Signed)
Signout from Dr. Roxan Hockeyobinson in this 41 year old patient with a new pelvic mass. Also with shortness of breath with suspected PE. Physical Exam  BP (!) 165/96   Pulse 86   Temp 98.8 F (37.1 C) (Oral)   Resp (!) 25   Ht 5\' 6"  (1.676 m)   Wt (!) 145 kg (319 lb 10.7 oz)   LMP 06/13/2017   SpO2 95%   BMI 51.60 kg/m   Physical Exam  ED Course  Procedures  MDM Patient unable to undergo a VQ scan because unable to lie flat. I discussed the case with the CT tech who consulted radiology and they'll be able to do a CT angiography.  ----------------------------------------- 8:07 PM on 07/25/2017 -----------------------------------------  No PE on the CT angiography. However, possible lung metastases. I discussed this result with the patient. She'll be following up with OB/GYN. She is understanding of the diagnosis as well as the treatment plan and will follow up as soon as possible in office. She is understanding when to comply.       Sophia Brooks, Sophia Espinal Matthew, MD 07/25/17 2007

## 2017-07-25 NOTE — ED Notes (Signed)
Patient transported to CT 

## 2017-07-25 NOTE — ED Triage Notes (Signed)
Pt presents to ED reporting generalized abd pain since yesterday. Pain is described as a pressure. Pt denies constipation but verbalized nausea be ginning today. Pt also reports today pain began in her chest and she has had a SOB when standing and ambulating. No cough. No fevers. Pt denies dizziness or lightheadedness.

## 2017-07-25 NOTE — Discharge Instructions (Addendum)
°  You have been seen in the emergency department for emergency care. It is important that you contact your own doctor, specialist or the closest clinic for follow-up care. Please bring this instruction sheet, all medications and X-ray copies with you when you are seen for follow-up care.  Determining the exact cause for all patients with abdominal pain is extremely difficult in the emergency department. Our primary focus is to rule-out immediate life-threatening diseases. If no immediate source of pain is found the definitive diagnosis frequently needs to be determined over time.Many times your primary care physician can determine the cause by following the symptoms over time. Sometimes, specialist are required such as Gastroenterologists, Gynecologists, Urologists or Surgeons. Please return immediately to the Emergency Department for fever>101, Vomiting or Intractable Pain. You should return to the emergency department or see your primary care provider in 12-24hrs if your pain is no better and sooner if your pain becomes worse.    FINDINGS: Lower chest: Scattered nodules are noted throughout the lung bases. The largest of these measures approximately 7.5 mm.   Hepatobiliary: The liver is diffusely decreased in attenuation consistent with fatty infiltration. No ductal dilatation or focal mass is seen. The gallbladder is well distended and within normal limits.   Pancreas: Unremarkable. No pancreatic ductal dilatation or surrounding inflammatory changes.   Spleen: Normal in size without focal abnormality.   Adrenals/Urinary Tract: The adrenal glands are within normal limits. Kidneys show no renal calculi or obstructive change. The bladder is partially distended.   Stomach/Bowel: The appendix is well visualized and within normal limits. No obstructive or inflammatory changes in the bowel are seen.   Vascular/Lymphatic: Aortic atherosclerosis. No enlarged abdominal or pelvic lymph nodes.   Reproductive: Uterus is well visualized and within normal limits. Some cystic changes are noted within the left ovary. The right ovary appears enlarged with a partially solid appearance. The ovary measures 4.6 cm. This raises suspicion for ovarian mass.   Other: Mild ascites is noted within the abdomen. No definitive peritoneal implants are seen. This also contributes to the suggestion of ovarian neoplasm   Musculoskeletal: Mild degenerative change of the lumbar spine is seen.   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 Ultrasound of the pelvis may be helpful for further definition of the right ovarian changes. Contrast enhanced CT of the chest when able may also be helpful.

## 2017-07-26 DIAGNOSIS — N83209 Unspecified ovarian cyst, unspecified side: Secondary | ICD-10-CM | POA: Insufficient documentation

## 2017-07-26 DIAGNOSIS — Z6841 Body Mass Index (BMI) 40.0 and over, adult: Secondary | ICD-10-CM | POA: Insufficient documentation

## 2017-07-26 LAB — CA 125: CANCER ANTIGEN (CA) 125: 11.2 U/mL (ref 0.0–38.1)

## 2017-12-16 ENCOUNTER — Ambulatory Visit
Admission: EM | Admit: 2017-12-16 | Discharge: 2017-12-16 | Disposition: A | Discharge status: Home / Self Care | Payer: BLUE CROSS/BLUE SHIELD | Attending: Family Medicine | Admitting: Family Medicine

## 2017-12-16 ENCOUNTER — Encounter: Payer: Self-pay | Admitting: *Deleted

## 2017-12-16 ENCOUNTER — Other Ambulatory Visit: Payer: Self-pay

## 2017-12-16 DIAGNOSIS — R05 Cough: Secondary | ICD-10-CM | POA: Diagnosis not present

## 2017-12-16 DIAGNOSIS — J209 Acute bronchitis, unspecified: Secondary | ICD-10-CM | POA: Diagnosis not present

## 2017-12-16 MED ORDER — HYDROCOD POLST-CPM POLST ER 10-8 MG/5ML PO SUER: 5.0000 mL | Freq: Two times a day (BID) | ORAL | 0 refills | Status: DC | PRN

## 2017-12-16 MED ORDER — PREDNISONE 50 MG PO TABS: ORAL_TABLET | ORAL | 0 refills | Status: DC

## 2017-12-16 MED ORDER — DOXYCYCLINE HYCLATE 100 MG PO CAPS: 100.0000 mg | ORAL_CAPSULE | Freq: Two times a day (BID) | ORAL | 0 refills | Status: DC

## 2017-12-16 NOTE — ED Provider Notes (Signed)
[facility]  CSN: [id number] Arrival date & time: 12/16/17  1519  History   Chief Complaint Chief Complaint  Patient presents with  . Cough  . Nasal Congestion   HPI  42 year old female who is morbidly obese presents with cough and congestion.  Started on Thursday.  She has been experiencing worsening cough particularly at night. Severe. Cough is productive of yellow, brown sputum.  Been using Coricidin without improvement.  No known exacerbating factors.  She reports associated congestion.  No other associated symptoms.  No other complaints at this time.  Past Medical History:  Diagnosis Date  . Cellulitis    acute cellulitis of the right lower extremity  . Hypertension    Patient Active Problem List   Diagnosis Date Noted  . HTN (hypertension) 01/28/2014  . Borderline diabetes 01/28/2014  . Cellulitis 01/28/2014  . Acute diastolic CHF (congestive heart failure) (HCC) 01/28/2014  . Shortness of breath 01/28/2014    Past Surgical History:  Procedure Laterality Date  . TONSILLECTOMY  2004    OB History    No data available       Home Medications    Prior to Admission medications   Medication Sig Start Date End Date Taking? Authorizing Provider  albuterol (ACCUNEB) 1.25 MG/3ML nebulizer solution Take 1 ampule by nebulization every 6 (six) hours as needed for wheezing.   Yes [provider]  metoprolol succinate (TOPROL-XL) 50 MG 24 hr tablet Take 50 mg by mouth daily. Take with or immediately following a meal.   Yes [provider]  chlorpheniramine-HYDROcodone (TUSSIONEX PENNKINETIC ER) 10-8 MG/5ML SUER Take 5 mLs by mouth every 12 (twelve) hours as needed. 12/16/17   Tommie Sams, DO  doxycycline (VIBRAMYCIN) 100 MG capsule Take 1 capsule (100 mg total) by mouth 2 (two) times daily. 12/16/17   Tommie Sams, DO  predniSONE (DELTASONE) 50 MG tablet 1 tablet daily x 5 days. 12/16/17   Tommie Sams, DO    Family History Family History    Problem Relation Age of Onset  . Hypertension Mother   . Heart attack Father 33  . Heart disease Father        3 stents placed LCA  . Hypertension Sister   . Heart attack Paternal Grandmother 89    Social History Social History   Tobacco Use  . Smoking status: Never Smoker  . Smokeless tobacco: Never Used  Substance Use Topics  . Alcohol use: No  . Drug use: No     Allergies   Sulfa antibiotics and Tessalon [benzonatate]   Review of Systems Review of Systems  Constitutional: Negative for fever.  HENT: Positive for congestion.   Respiratory: Positive for cough.   All other systems reviewed and are negative.  Physical Exam Triage Vital Signs ED Triage Vitals  Enc Vitals Group     BP 12/16/17 1547 (!) 181/93     Pulse Rate 12/16/17 1547 90     Resp 12/16/17 1547 16     Temp 12/16/17 1547 98.7 F (37.1 C)     Temp Source 12/16/17 1547 Oral     SpO2 12/16/17 1547 96 %     Weight 12/16/17 1547 (!) 317 lb (143.8 kg)     Height 12/16/17 1547 5\' 6"  (1.676 m)     Head Circumference --      Peak Flow --      Pain Score 12/16/17 1548 6     Pain Loc --  Pain Edu? --      Excl. in GC? --    Updated Vital Signs BP (!) 181/93 (BP Location: Left Arm)   Pulse 90   Temp 98.7 F (37.1 C) (Oral)   Resp 16   Ht 5\' 6"  (1.676 m)   Wt (!) 317 lb (143.8 kg)   LMP 11/21/2017   SpO2 96%   BMI 51.17 kg/m    Physical Exam  Constitutional: She is oriented to person, place, and time. She appears well-developed. No distress.  HENT:  Head: Normocephalic and atraumatic.  Mouth/Throat: Oropharynx is clear and moist.  Normal TM's.  Eyes: Conjunctivae are normal.  Neck: Neck supple.  Cardiovascular: Normal rate and regular rhythm.  No murmur heard. Pulmonary/Chest: Effort normal and breath sounds normal. She has no wheezes. She has no rales.  Lymphadenopathy:    She has no cervical adenopathy.  Neurological: She is alert and oriented to person, place, and time.  Skin:  Skin is warm. No rash noted.  Psychiatric: She has a normal mood and affect. Her behavior is normal.  Nursing note and vitals reviewed.  UC Treatments / Results  Labs (all labs ordered are listed, but only abnormal results are displayed) Labs Reviewed - No data to display  EKG  EKG Interpretation None       Radiology No results found.  Procedures Procedures (including critical care time)  Medications Ordered in UC Medications - No data to display   Initial Impression / Assessment and Plan / UC Course  I have reviewed the triage vital signs and the nursing notes.  Pertinent labs & imaging results that were available during my care of the patient were reviewed by me and considered in my medical decision making (see chart for details).     42 year old female presents with acute bronchitis.  Treating with prednisone and Tussionex.  If she fails to improve or worsens, she can start the antibiotic.  Final Clinical Impressions(s) / UC Diagnoses   Final diagnoses:  Acute bronchitis, unspecified organism    ED Discharge Orders        Ordered    chlorpheniramine-HYDROcodone (TUSSIONEX PENNKINETIC ER) 10-8 MG/5ML SUER  Every 12 hours PRN     12/16/17 1616    predniSONE (DELTASONE) 50 MG tablet     12/16/17 1616    doxycycline (VIBRAMYCIN) 100 MG capsule  2 times daily     12/16/17 1616     Controlled Substance Prescriptions [place] Controlled Substance Registry consulted? No   Tommie SamsCook, Sole Lengacher G, [place]DO 12/16/17 1643

## 2017-12-16 NOTE — Discharge Instructions (Signed)
This is likely viral.  Prednisone and cough medication.  If you fail to improve or worsen, you can start the antibiotic.  Take care  Dr. Adriana Simasook

## 2018-01-07 ENCOUNTER — Ambulatory Visit
Admission: RE | Admit: 2018-01-07 | Discharge: 2018-01-07 | Disposition: A | Discharge status: Home / Self Care | Payer: BLUE CROSS/BLUE SHIELD | Source: Ambulatory Visit | Attending: Family Medicine | Admitting: Family Medicine

## 2018-01-07 ENCOUNTER — Other Ambulatory Visit: Payer: Self-pay | Admitting: Family Medicine

## 2018-01-07 DIAGNOSIS — J9811 Atelectasis: Secondary | ICD-10-CM | POA: Insufficient documentation

## 2018-01-07 DIAGNOSIS — R059 Cough, unspecified: Secondary | ICD-10-CM

## 2018-01-07 DIAGNOSIS — R05 Cough: Secondary | ICD-10-CM | POA: Diagnosis not present

## 2018-01-25 DIAGNOSIS — E119 Type 2 diabetes mellitus without complications: Secondary | ICD-10-CM | POA: Insufficient documentation

## 2018-02-14 ENCOUNTER — Emergency Department: Payer: BC Managed Care – PPO

## 2018-02-14 ENCOUNTER — Emergency Department
Admission: EM | Admit: 2018-02-14 | Discharge: 2018-02-14 | Disposition: A | Discharge status: Home / Self Care | Payer: BC Managed Care – PPO | Attending: Student in an Organized Health Care Education/Training Program | Admitting: Student in an Organized Health Care Education/Training Program

## 2018-02-14 ENCOUNTER — Other Ambulatory Visit: Payer: Self-pay

## 2018-02-14 DIAGNOSIS — I5031 Acute diastolic (congestive) heart failure: Secondary | ICD-10-CM | POA: Insufficient documentation

## 2018-02-14 DIAGNOSIS — Z79899 Other long term (current) drug therapy: Secondary | ICD-10-CM | POA: Diagnosis not present

## 2018-02-14 DIAGNOSIS — R197 Diarrhea, unspecified: Secondary | ICD-10-CM | POA: Diagnosis not present

## 2018-02-14 DIAGNOSIS — E119 Type 2 diabetes mellitus without complications: Secondary | ICD-10-CM | POA: Diagnosis not present

## 2018-02-14 DIAGNOSIS — R112 Nausea with vomiting, unspecified: Secondary | ICD-10-CM | POA: Diagnosis not present

## 2018-02-14 DIAGNOSIS — I10 Essential (primary) hypertension: Secondary | ICD-10-CM | POA: Diagnosis not present

## 2018-02-14 DIAGNOSIS — Z881 Allergy status to other antibiotic agents status: Secondary | ICD-10-CM | POA: Insufficient documentation

## 2018-02-14 DIAGNOSIS — R1013 Epigastric pain: Secondary | ICD-10-CM

## 2018-02-14 HISTORY — DX: Type 2 diabetes mellitus without complications: E11.9

## 2018-02-14 LAB — COMPREHENSIVE METABOLIC PANEL
ALBUMIN: 4.7 g/dL (ref 3.5–5.0)
ALK PHOS: 40 U/L (ref 38–126)
ALT: 31 U/L (ref 14–54)
ANION GAP: 10 (ref 5–15)
AST: 36 U/L (ref 15–41)
BILIRUBIN TOTAL: 2.3 mg/dL — AB (ref 0.3–1.2)
BUN: 10 mg/dL (ref 6–20)
CALCIUM: 8.5 mg/dL — AB (ref 8.9–10.3)
CO2: 22 mmol/L (ref 22–32)
CREATININE: 0.66 mg/dL (ref 0.44–1.00)
Chloride: 103 mmol/L (ref 101–111)
GFR calc Af Amer: >60 mL/min (ref >60)
GFR calc non Af Amer: >60 mL/min (ref >60)
GLUCOSE: 152 mg/dL — AB (ref 65–99)
Potassium: 4.3 mmol/L (ref 3.5–5.1)
Sodium: 135 mmol/L (ref 135–145)
TOTAL PROTEIN: 7.9 g/dL (ref 6.5–8.1)

## 2018-02-14 LAB — URINALYSIS, COMPLETE (UACMP) WITH MICROSCOPIC
Bacteria, UA: NONE SEEN
Bilirubin Urine: NEGATIVE
GLUCOSE, UA: NEGATIVE mg/dL
KETONES UR: 5 mg/dL — AB
Leukocytes, UA: NEGATIVE
NITRITE: NEGATIVE
PH: 5 (ref 5.0–8.0)
Protein, ur: NEGATIVE mg/dL
SPECIFIC GRAVITY, URINE: 1.023 (ref 1.005–1.030)

## 2018-02-14 LAB — CBC
HCT: 44.6 % (ref 35.0–47.0)
HEMOGLOBIN: 14.5 g/dL (ref 12.0–16.0)
MCH: 28 pg (ref 26.0–34.0)
MCHC: 32.6 g/dL (ref 32.0–36.0)
MCV: 86 fL (ref 80.0–100.0)
PLATELETS: 221 10*3/uL (ref 150–440)
RBC: 5.18 MIL/uL (ref 3.80–5.20)
RDW: 14.5 % (ref 11.5–14.5)
WBC: 6 10*3/uL (ref 3.6–11.0)

## 2018-02-14 LAB — TROPONIN I: Troponin I: <0.03 ng/mL (ref <0.03)

## 2018-02-14 LAB — POCT PREGNANCY, URINE: PREG TEST UR: NEGATIVE

## 2018-02-14 LAB — LIPASE, BLOOD: Lipase: 25 U/L (ref 11–51)

## 2018-02-14 MED ORDER — PROMETHAZINE HCL 25 MG/ML IJ SOLN
25.0000 mg | Freq: Four times a day (QID) | INTRAMUSCULAR | Status: DC | PRN
  Administered 2018-02-14: 25 mg via INTRAMUSCULAR
  Filled 2018-02-14: qty 1

## 2018-02-14 MED ORDER — PROMETHAZINE HCL 12.5 MG PO TABS: 12.5000 mg | ORAL_TABLET | Freq: Four times a day (QID) | ORAL | 0 refills | Status: DC | PRN

## 2018-02-14 MED ORDER — GI COCKTAIL ~~LOC~~
30.0000 mL | Freq: Once | ORAL | Status: AC
  Administered 2018-02-14: 30 mL via ORAL
  Filled 2018-02-14: qty 30

## 2018-02-14 NOTE — ED Provider Notes (Signed)
[facility] Emergency Department Provider Note    None    (approximate)  I have reviewed the triage vital signs and the nursing notes.   HISTORY  Chief Complaint Chest Pain and Emesis    HPI Sophia Brooks is a 42 y.o. female presents with chief complaint of epigastric pain nausea vomiting diarrhea.  Started feeling some shortness of breath and chest pain yesterday and then woke up this morning with several episodes of vomiting and has had 8-9 episodes of watery nonbloody non-melanotic diarrhea today.  Denies any sick contacts.  No new medications.  States the pain is epigastric without radiation to the right upper quadrant.  States it is mild to moderate and constant crampy pain is somewhat relieved after moving her bowels.  Has not been able to keep anything down.  Is not tried anything to remedy the symptoms at home.  Past Medical History:  Diagnosis Date  . Cellulitis    acute cellulitis of the right lower extremity  . Diabetes mellitus without complication (HCC)   . Hypertension    Family History  Problem Relation Age of Onset  . Hypertension Mother   . Heart attack Father 2  . Heart disease Father        3 stents placed LCA  . Hypertension Sister   . Heart attack Paternal Grandmother 77   Past Surgical History:  Procedure Laterality Date  . TONSILLECTOMY  2004   Patient Active Problem List   Diagnosis Date Noted  . HTN (hypertension) 01/28/2014  . Borderline diabetes 01/28/2014  . Cellulitis 01/28/2014  . Acute diastolic CHF (congestive heart failure) (HCC) 01/28/2014  . Shortness of breath 01/28/2014      Prior to Admission medications   Medication Sig Start Date End Date Taking? Authorizing Provider  albuterol (ACCUNEB) 1.25 MG/3ML nebulizer solution Take 1 ampule by nebulization every 6 (six) hours as needed for wheezing.    [provider]  chlorpheniramine-HYDROcodone (TUSSIONEX PENNKINETIC ER) 10-8 MG/5ML SUER  Take 5 mLs by mouth every 12 (twelve) hours as needed. 12/16/17   Tommie Sams, DO  doxycycline (VIBRAMYCIN) 100 MG capsule Take 1 capsule (100 mg total) by mouth 2 (two) times daily. 12/16/17   Tommie Sams, DO  metoprolol succinate (TOPROL-XL) 50 MG 24 hr tablet Take 50 mg by mouth daily. Take with or immediately following a meal.    [provider]  predniSONE (DELTASONE) 50 MG tablet 1 tablet daily x 5 days. 12/16/17   Tommie Sams, DO  promethazine (PHENERGAN) 12.5 MG tablet Take 1 tablet (12.5 mg total) by mouth every 6 (six) hours as needed for nausea or vomiting. 02/14/18   Willy Eddy, MD    Allergies Sulfa antibiotics and Tessalon [benzonatate]    Social History Social History   Tobacco Use  . Smoking status: Never Smoker  . Smokeless tobacco: Never Used  Substance Use Topics  . Alcohol use: No  . Drug use: No    Review of Systems Patient denies headaches, rhinorrhea, blurry vision, numbness, shortness of breath, chest pain, edema, cough, abdominal pain, nausea, vomiting, diarrhea, dysuria, fevers, rashes or hallucinations unless otherwise stated above in HPI. ____________________________________________   PHYSICAL EXAM:  VITAL SIGNS: Vitals:   02/14/18 1202  BP: 133/71  Pulse: (!) 110  Resp: 15  Temp: 98.4 F (36.9 C)  SpO2: 97%    Constitutional: Alert and oriented. Morbidly obese but otherwise well appearing and in no acute distress. Eyes: Conjunctivae are  normal.  Head: Atraumatic. Nose: No congestion/rhinnorhea. Mouth/Throat: Mucous membranes are moist.   Neck: No stridor. Painless ROM.  Cardiovascular: Normal rate, regular rhythm. Grossly normal heart sounds.  Good peripheral circulation. Respiratory: Normal respiratory effort.  No retractions. Lungs CTAB. Gastrointestinal: Soft with mild epigastric ttp, no peritonitis.   No distention. No abdominal bruits. No CVA tenderness. Genitourinary:  Musculoskeletal: No lower extremity tenderness  nor edema.  No joint effusions. Neurologic:  Normal speech and language. No gross focal neurologic deficits are appreciated. No facial droop Skin:  Skin is warm, dry and intact. No rash noted. Psychiatric: Mood and affect are normal. Speech and behavior are normal.  ____________________________________________   LABS (all labs ordered are listed, but only abnormal results are displayed)  Results for orders placed or performed during the hospital encounter of 02/14/18 (from the past 24 hour(s))  Lipase, blood     Status: None   Collection Time: 02/14/18 12:14 PM  Result Value Ref Range   Lipase 25 11 - 51 U/L  Comprehensive metabolic panel     Status: Abnormal   Collection Time: 02/14/18 12:14 PM  Result Value Ref Range   Sodium 135 135 - 145 mmol/L   Potassium 4.3 3.5 - 5.1 mmol/L   Chloride 103 101 - 111 mmol/L   CO2 22 22 - 32 mmol/L   Glucose, Bld 152 (H) 65 - 99 mg/dL   BUN 10 6 - 20 mg/dL   Creatinine, Ser 1.61 0.44 - 1.00 mg/dL   Calcium 8.5 (L) 8.9 - 10.3 mg/dL   Total Protein 7.9 6.5 - 8.1 g/dL   Albumin 4.7 3.5 - 5.0 g/dL   AST 36 15 - 41 U/L   ALT 31 14 - 54 U/L   Alkaline Phosphatase 40 38 - 126 U/L   Total Bilirubin 2.3 (H) 0.3 - 1.2 mg/dL   GFR calc non Af Amer >60 >60 mL/min   GFR calc Af Amer >60 >60 mL/min   Anion gap 10 5 - 15  CBC     Status: None   Collection Time: 02/14/18 12:14 PM  Result Value Ref Range   WBC 6.0 3.6 - 11.0 K/uL   RBC 5.18 3.80 - 5.20 MIL/uL   Hemoglobin 14.5 12.0 - 16.0 g/dL   HCT 09.6 04.5 - 40.9 %   MCV 86.0 80.0 - 100.0 fL   MCH 28.0 26.0 - 34.0 pg   MCHC 32.6 32.0 - 36.0 g/dL   RDW 81.1 91.4 - 78.2 %   Platelets 221 150 - 440 K/uL  Troponin I     Status: None   Collection Time: 02/14/18 12:14 PM  Result Value Ref Range   Troponin I <0.03 <0.03 ng/mL  Urinalysis, Complete w Microscopic     Status: Abnormal   Collection Time: 02/14/18  3:20 PM  Result Value Ref Range   Color, Urine YELLOW (A) YELLOW   APPearance HAZY (A)  CLEAR   Specific Gravity, Urine 1.023 1.005 - 1.030   pH 5.0 5.0 - 8.0   Glucose, UA NEGATIVE NEGATIVE mg/dL   Hgb urine dipstick SMALL (A) NEGATIVE   Bilirubin Urine NEGATIVE NEGATIVE   Ketones, ur 5 (A) NEGATIVE mg/dL   Protein, ur NEGATIVE NEGATIVE mg/dL   Nitrite NEGATIVE NEGATIVE   Leukocytes, UA NEGATIVE NEGATIVE   RBC / HPF 0-5 0 - 5 RBC/hpf   WBC, UA 0-5 0 - 5 WBC/hpf   Bacteria, UA NONE SEEN NONE SEEN   Squamous Epithelial / LPF 6-30 (A)  NONE SEEN   Mucus PRESENT    Amorphous Crystal PRESENT   Pregnancy, urine POC     Status: None   Collection Time: 02/14/18  3:23 PM  Result Value Ref Range   Preg Test, Ur NEGATIVE NEGATIVE   ____________________________________________  EKG My review and personal interpretation at Time: 12:06   Indication: epigastric pain  Rate: 110  Rhythm: sinus Axis: normal  Other: nonspecific st abnormality unchanged from previous tracing 8/15 ____________________________________________  RADIOLOGY  I personally reviewed all radiographic images ordered to evaluate for the above acute complaints and reviewed radiology reports and findings.  These findings were personally discussed with the patient.  Please see medical record for radiology report.  ____________________________________________   PROCEDURES  Procedure(s) performed:  Procedures    Critical Care performed: no ____________________________________________   INITIAL IMPRESSION / ASSESSMENT AND PLAN / ED COURSE  Pertinent labs & imaging results that were available during my care of the patient were reviewed by me and considered in my medical decision making (see chart for details).  DDX: Gastroenteritis, gastritis, cholelithiasis, cholecystitis, SBO, perforation, pneumonia, ACS, pericarditis  Sophia Brooks is a 42 y.o. who presents to the ED with symptoms as described above.  Patient well-appearing.  Mildly tachycardic but also was unable to take her medication this  morning for metoprolol.  Appears well perfused.  Abdominal exam is soft benign.  Symptoms most clinically consistent with gastroenteritis.  Possible food poisoning.  Not clinically consistent with acute appendicitis cholelithiasis cholecystitis.  Clinically consistent with pancreatitis.  Patient symptomatically improved after IM Phenergan.  Tolerating oral hydration.  At this point do believe patient stable and appropriate for trial of outpatient management.  Discussed signs and symptoms for which she should return immediately to the hospital.  Have discussed with the patient and available family all diagnostics and treatments performed thus far and all questions were answered to the best of my ability. The patient demonstrates understanding and agreement with plan.       ____________________________________________   FINAL CLINICAL IMPRESSION(S) / ED DIAGNOSES  Final diagnoses:  Nausea vomiting and diarrhea  Epigastric pain      NEW MEDICATIONS STARTED DURING THIS VISIT:  New Prescriptions   PROMETHAZINE (PHENERGAN) 12.5 MG TABLET    Take 1 tablet (12.5 mg total) by mouth every 6 (six) hours as needed for nausea or vomiting.     Note:  This document was prepared using Dragon voice recognition software and may include unintentional dictation errors.    Willy Eddyobinson, Lativia Velie, MD 02/14/18 1650

## 2018-02-14 NOTE — Discharge Instructions (Signed)

## 2018-02-14 NOTE — ED Notes (Signed)
gingedr ale given and instructed on need for urine specimen

## 2018-02-14 NOTE — ED Notes (Signed)
FIRST NURSE NOTE: pt c/o intermittent chest pain with cough for the past month. Pt is asking about wait times on arrival..

## 2018-02-14 NOTE — ED Triage Notes (Signed)
Pt states she has had bronchitis - yesterday she started with chest pain and weakness in her left armk - she started vomiting this am (4-5 times) - c/o shortness of breath r/t vomiting - diarrhea (8 loose stools in 24 hours)

## 2018-04-22 DIAGNOSIS — R918 Other nonspecific abnormal finding of lung field: Secondary | ICD-10-CM | POA: Insufficient documentation

## 2018-05-01 ENCOUNTER — Encounter: Payer: Self-pay | Admitting: Urology

## 2018-05-01 ENCOUNTER — Ambulatory Visit: Payer: BC Managed Care – PPO | Admitting: Urology

## 2018-05-01 VITALS — BP 163/83 | HR 81 | Ht 66.0 in | Wt 322.2 lb

## 2018-05-01 DIAGNOSIS — R319 Hematuria, unspecified: Secondary | ICD-10-CM | POA: Diagnosis not present

## 2018-05-01 DIAGNOSIS — R739 Hyperglycemia, unspecified: Secondary | ICD-10-CM | POA: Insufficient documentation

## 2018-05-01 DIAGNOSIS — G4733 Obstructive sleep apnea (adult) (pediatric): Secondary | ICD-10-CM | POA: Insufficient documentation

## 2018-05-01 LAB — URINALYSIS, COMPLETE
Bilirubin, UA: NEGATIVE
GLUCOSE, UA: NEGATIVE
NITRITE UA: NEGATIVE
Urobilinogen, Ur: 0.2 mg/dL (ref 0.2–1.0)
pH, UA: 5.5 (ref 5.0–7.5)

## 2018-05-01 LAB — MICROSCOPIC EXAMINATION

## 2018-05-01 NOTE — Progress Notes (Signed)
05/01/2018 9:23 AM   Ardine Eng Elmo Putt [birth year] [id number]  Referring provider: Hortencia Pilar, MD [address] [place], [place] [address]  Chief Complaint  Patient presents with  . Hematuria    HPI: Ms. Geyer is a 42 year old female seen in consultation at the request of Dr. Kym Groom for evaluation of hematuria.  She was seen in the ED in early March complaining of epigastric pain.  A urinalysis at that visit showed small blood on dipstick but no RBCs on microscopy.  She was seen in follow-up at [facility] in mid April 2019 and a urinalysis showed trace blood on dipstick however no microscopy was performed.  She was referred here because of abdominal pain and hematuria.  She states her abdominal pain has resolved and was due to a medication she was taking.  She denies gross hematuria.  She has no voiding complaints.  There is no previous history of urologic problems.  She is at the very end of her menstrual cycle.   PMH: Past Medical History:  Diagnosis Date  . Cellulitis    acute cellulitis of the right lower extremity  . Diabetes mellitus without complication ([place])   . Hypertension     Surgical History: Past Surgical History:  Procedure Laterality Date  . OOPHORECTOMY Right    Ovary and tube  . TONSILLECTOMY  2004    Home Medications:  Allergies as of 05/01/2018      Reactions   Sulfa Antibiotics    Upset stomach   Tessalon [benzonatate]    Elevated Blood Pressure      Medication List        Accurate as of 05/01/18  9:23 AM. Always use your most recent med list.          ibuprofen 800 MG tablet Commonly known as:  ADVIL,MOTRIN TK 1 T PO Q 6 H PRN FOR PAIN FOR UP TO 10 DAYS   metFORMIN 500 MG tablet Commonly known as:  GLUCOPHAGE Take 500 mg by mouth daily with breakfast.   metoprolol succinate 50 MG 24 hr tablet Commonly known as:  TOPROL-XL Take 50 mg by mouth daily. Take with or immediately following a meal.   ONE TOUCH ULTRA 2 w/Device  Kit CHECK BLOOD SUGAR ONCE D   ONE TOUCH ULTRA TEST test strip Generic drug:  glucose blood CHECK BLOOD SUGAR ONCE D   ONETOUCH DELICA LANCETS 18E Misc CHECK BLOOD SUGAR ONCE D   UNABLE TO FIND nightly BIPAP       Allergies:  Allergies  Allergen Reactions  . Sulfa Antibiotics     Upset stomach  . Tessalon [Benzonatate]     Elevated Blood Pressure    Family History: Family History  Problem Relation Age of Onset  . Hypertension Mother   . Heart attack Father 57  . Heart disease Father        3 stents placed LCA  . Hypertension Sister   . Heart attack Paternal Grandmother 76    Social History:  reports that she has never smoked. She has never used smokeless tobacco. She reports that she does not drink alcohol or use drugs.  ROS: UROLOGY Frequent Urination?: No Hard to postpone urination?: No Burning/pain with urination?: No Get up at night to urinate?: Yes Leakage of urine?: No Urine stream starts and stops?: No Trouble starting stream?: No Do you have to strain to urinate?: No Blood in urine?: No Urinary tract infection?: No Sexually transmitted disease?: No Injury to kidneys  or bladder?: No Painful intercourse?: No Weak stream?: No Currently pregnant?: No Vaginal bleeding?: No Last menstrual period?: 04/26/2018  Gastrointestinal Nausea?: No Vomiting?: No Indigestion/heartburn?: No Diarrhea?: No Constipation?: No  Constitutional Fever: No Night sweats?: No Weight loss?: No Fatigue?: No  Skin Skin rash/lesions?: No Itching?: No  Eyes Blurred vision?: No Double vision?: No  Ears/Nose/Throat Sore throat?: No Sinus problems?: No  Hematologic/Lymphatic Swollen glands?: No Easy bruising?: No  Cardiovascular Leg swelling?: No Chest pain?: No  Respiratory Cough?: No Shortness of breath?: No  Endocrine Excessive thirst?: No  Musculoskeletal Back pain?: No Joint pain?: No  Neurological Headaches?: No Dizziness?:  No  Psychologic Depression?: No Anxiety?: No  Physical Exam: BP (!) 163/83 (BP Location: Right Arm, Patient Position: Sitting, Cuff Size: Large)   Pulse 81   Ht 5' 6"  (1.676 m)   Wt (!) 322 lb 3.2 oz (146.1 kg)   LMP 04/26/2018   SpO2 99%   BMI 52.00 kg/m   Constitutional:  Alert and oriented, No acute distress. HEENT: [place] AT, moist mucus membranes.  Trachea midline, no masses. Cardiovascular: No clubbing, cyanosis, or edema. Respiratory: Normal respiratory effort, no increased work of breathing. GI: Abdomen is soft, nontender, nondistended, no abdominal masses GU: No CVA tenderness Lymph: No cervical or inguinal lymphadenopathy. Skin: No rashes, bruises or suspicious lesions. Neurologic: Grossly intact, no focal deficits, moving all 4 extremities. Psychiatric: Normal mood and affect.  Laboratory Data: Lab Results  Component Value Date   WBC 6.0 02/14/2018   HGB 14.5 02/14/2018   HCT 44.6 02/14/2018   MCV 86.0 02/14/2018   PLT 221 02/14/2018    Lab Results  Component Value Date   CREATININE 0.66 02/14/2018    Urinalysis Dipstick 2+ blood/trace leukocytes; microscopy 6-10 WBC 3-10 RBC.  Assessment & Plan:   Based on American Urological Association practice guidelines asymptomatic microhematuria [facility]) is defined as three or greater red blood cells per high powered field on a properly collected urinary specimen in the absence of an obvious benign cause. A positive dipstick does not define AMH, and evaluation should be based solely on findings from microscopic examination of urinary sediment and not on a dipstick reading.  A urinalysis in March did not show clinically significant microhematuria and microscopy was not performed on her urinalysis in April.  Urinalysis today does show pyuria and microhematuria however she is completing her menstrual cycle.  A urine culture was ordered.  I recommended a repeat urinalysis with micro in 1 month.  If negative would not recommend  any further evaluation.    Abbie Sons, [place] [address] [place], [place] [address] [phone]

## 2018-05-04 LAB — CULTURE, URINE COMPREHENSIVE

## 2018-05-08 ENCOUNTER — Telehealth: Payer: Self-pay

## 2018-05-08 NOTE — Telephone Encounter (Signed)
Called pt, no answer. LM for pt informing her of the information below. Advised pt to call back for questions or concerns.  

## 2018-05-08 NOTE — Telephone Encounter (Signed)
-----   Message from Scott C Stoioff, MD sent at 05/07/2018 11:50 AM EDT ----- Urine culture was negative for infection 

## 2018-06-05 ENCOUNTER — Other Ambulatory Visit: Payer: BC Managed Care – PPO

## 2018-06-05 DIAGNOSIS — R319 Hematuria, unspecified: Secondary | ICD-10-CM

## 2018-06-06 LAB — URINALYSIS, COMPLETE
Bilirubin, UA: NEGATIVE
GLUCOSE, UA: NEGATIVE
KETONES UA: NEGATIVE
LEUKOCYTES UA: NEGATIVE
Nitrite, UA: NEGATIVE
RBC UA: NEGATIVE
Specific Gravity, UA: 1.02 (ref 1.005–1.030)
Urobilinogen, Ur: 0.2 mg/dL (ref 0.2–1.0)
pH, UA: 5.5 (ref 5.0–7.5)

## 2018-06-06 LAB — MICROSCOPIC EXAMINATION

## 2018-09-22 IMAGING — CT CT ANGIO CHEST
2 of 6 series · 18 of 46 positions shown · IV contrast (APPLIED)
Comparison: Abdominal CT and ultrasound 07/25/2017

CLINICAL DATA: Pulmonary nodules.  Short of breath.  Abdominal pain

EXAM:
CT ANGIOGRAPHY CHEST WITH CONTRAST
TECHNIQUE: Multidetector CT imaging of the chest was performed using the
standard protocol during bolus administration of intravenous
contrast. Multiplanar CT image reconstructions and MIPs were
obtained to evaluate the vascular anatomy.
CONTRAST:  75 mL Isovue

[Series 5: thins · axial · 0.67mm/px · z∈[-362,-120]mm · 16 of 266 slices shown]
[im 12/266  lung]
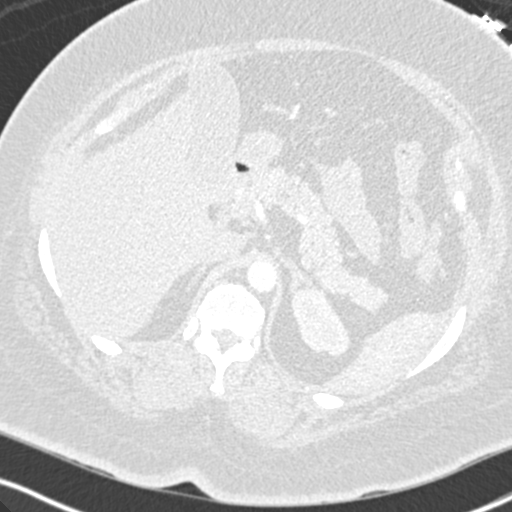
[im 35/266  soft-tissue]
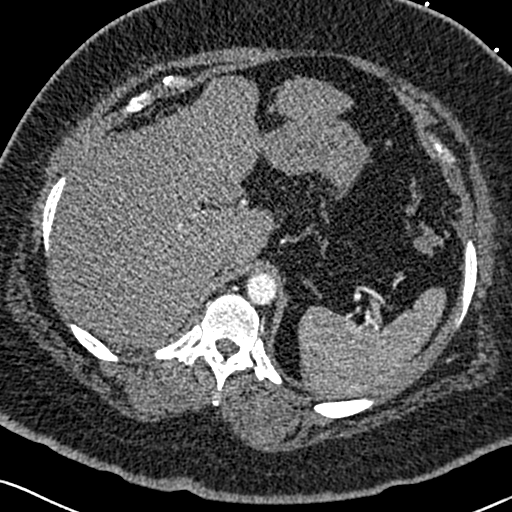
[im 47/266  lung]
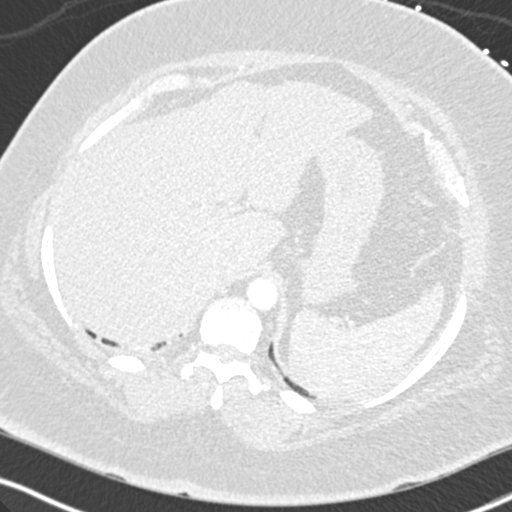
[im 58/266  soft-tissue]
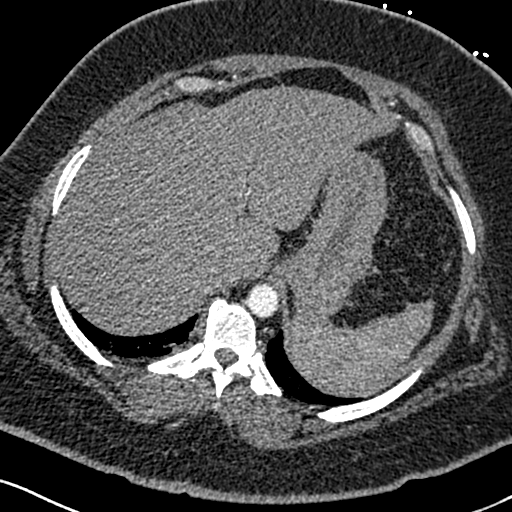
[im 81/266  lung]
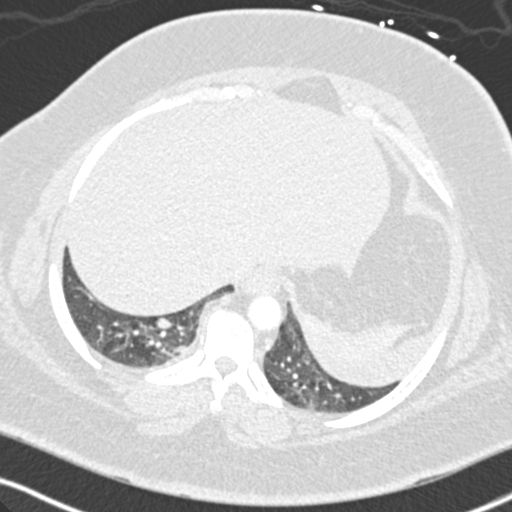
[im 93/266  soft-tissue]
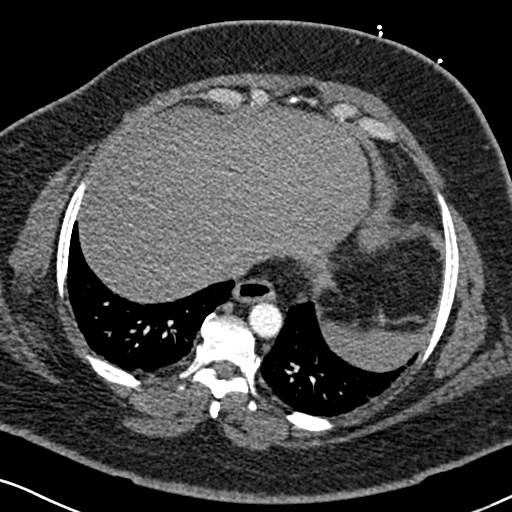
[im 104/266  lung]
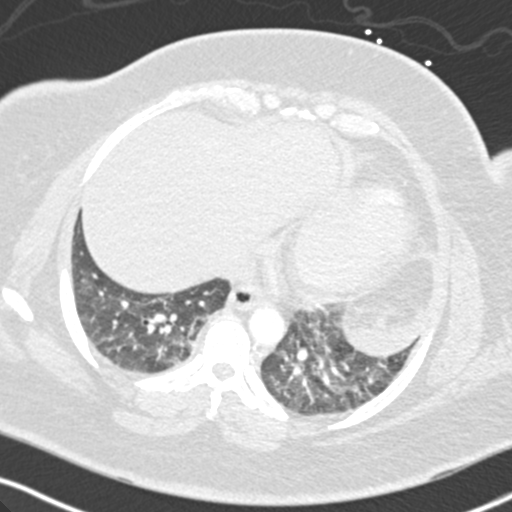
[im 127/266  soft-tissue]
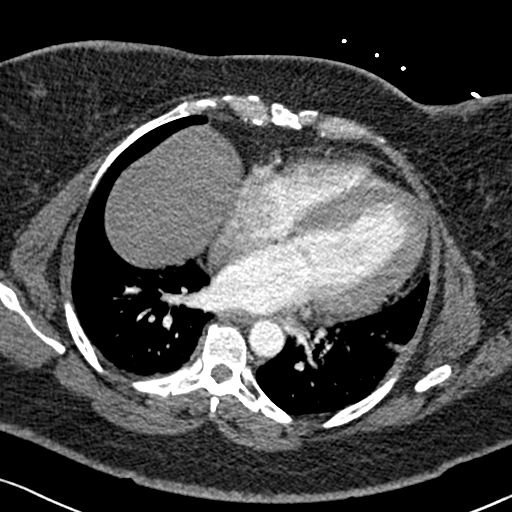
[im 139/266  lung]
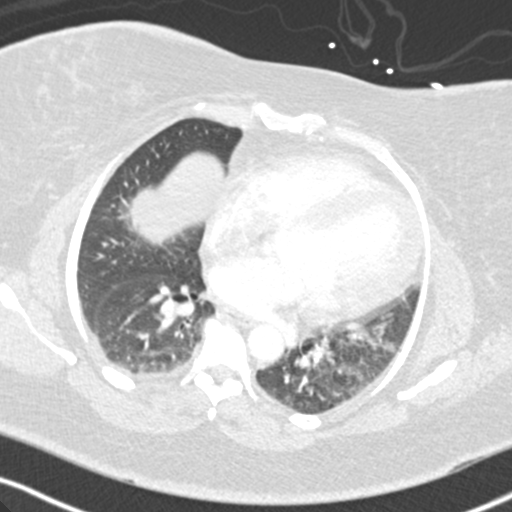
[im 162/266  soft-tissue]
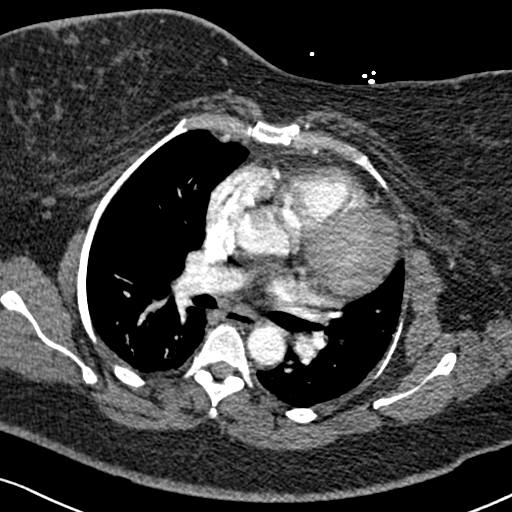
[im 173/266  lung]
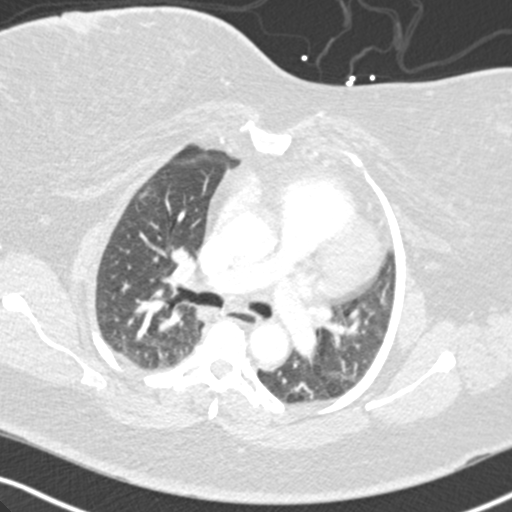
[im 185/266  soft-tissue]
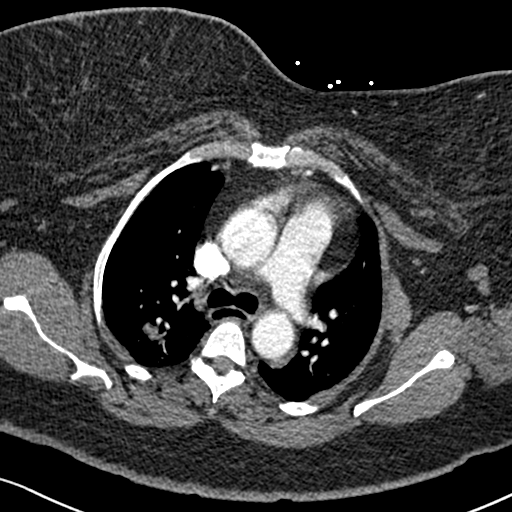
[im 208/266  lung]
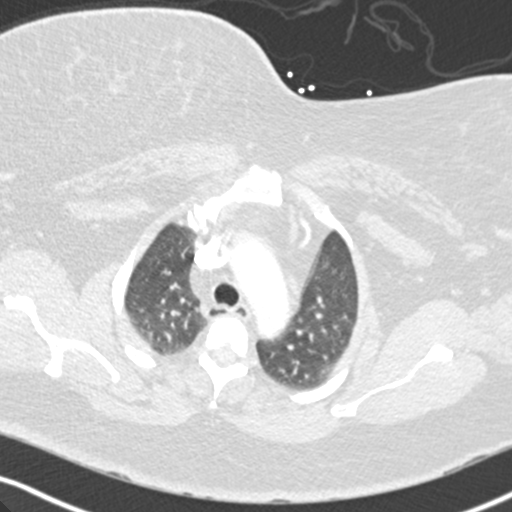
[im 219/266  soft-tissue]
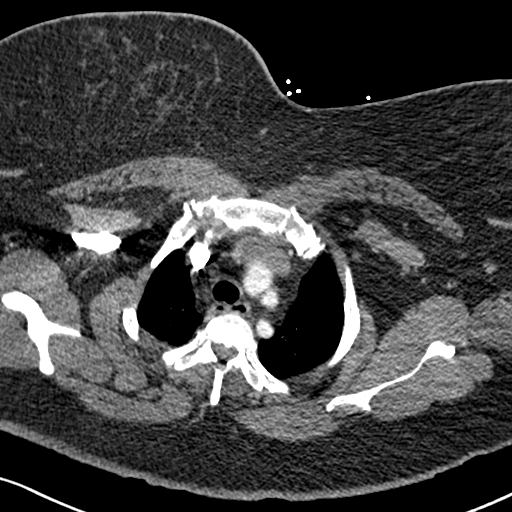
[im 231/266  lung]
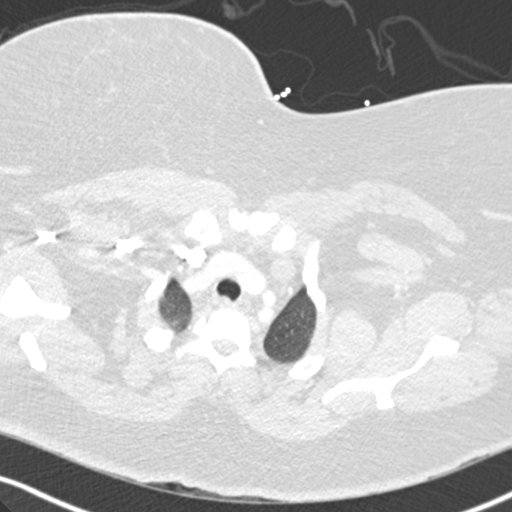
[im 254/266  soft-tissue]
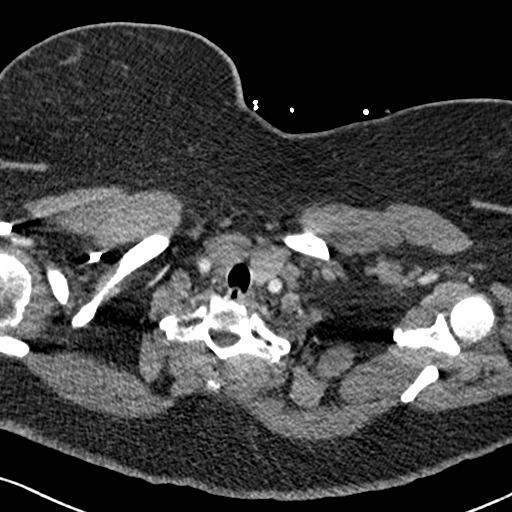

[Series 7: coronal mpr · coronal · 0.56mm/px · 2 of 92 slices shown]
[im 31/92  soft-tissue]
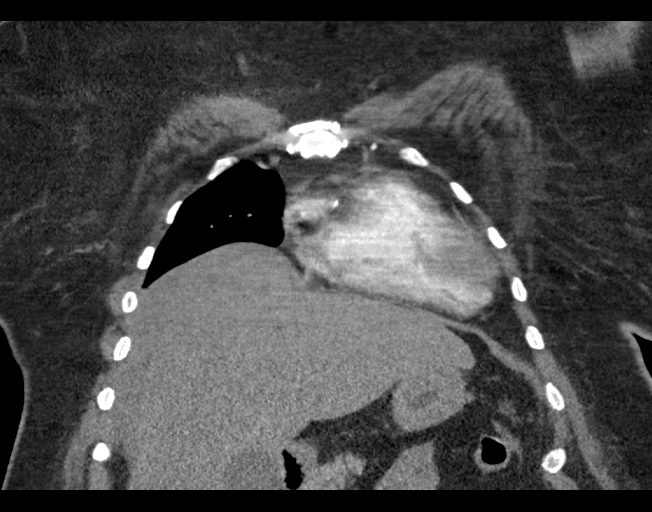
[im 61/92  soft-tissue]
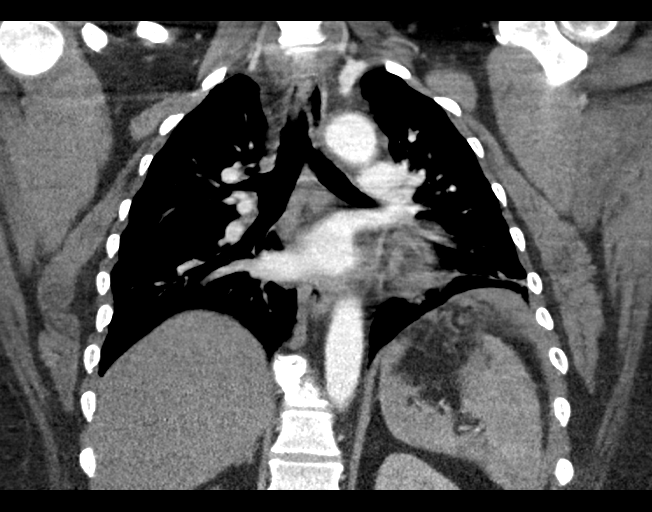

[18 of 46 positions shown; findings below may reference images not displayed]

FINDINGS: Cardiovascular: No filling defects within the pulmonary arteries
arteries to suggest acute pulmonary embolism.

Mediastinum/Nodes: No axillary or supraclavicular adenopathy. No
mediastinal hilar adenopathy. No pericardial fluid. Esophagus normal

Lungs/Pleura: Several round pulmonary nodules are present in the
RIGHT lung. Rounded upper lobe nodule measuring 8 mm on image 23,
series 6.

Less well-defined upper lung 12 mm nodule on image 26, series 6.

7 mm nodule image 35.

Nodules again demonstrated in the RIGHT lung base measuring up to 8
mm.

No LEFT lung nodules are present.

Mild interseptal thickening at the lung bases.

Upper Abdomen: Small amount free fluid along the margin liver.
Adrenal glands from the

Musculoskeletal: No aggressive osseous lesion

Review of the MIP images confirms the above findings.
IMPRESSION: 1. Multiple RIGHT pulmonary nodules of varying size are concerning
for pulmonary metastasis. Concern for ovarian origin on ultrasound
and comparison CT same day.
2. No evidence acute pulmonary embolism.
3. Mild interstitial edema

## 2018-09-22 IMAGING — CR DG CHEST 2V
2 series · 2 of 2 positions shown · non-contrast
Comparison: Chest x-ray dated January 22, 2014.

CLINICAL DATA: Shortness of breath.

EXAM:
CHEST  2 VIEW

[chest pa]
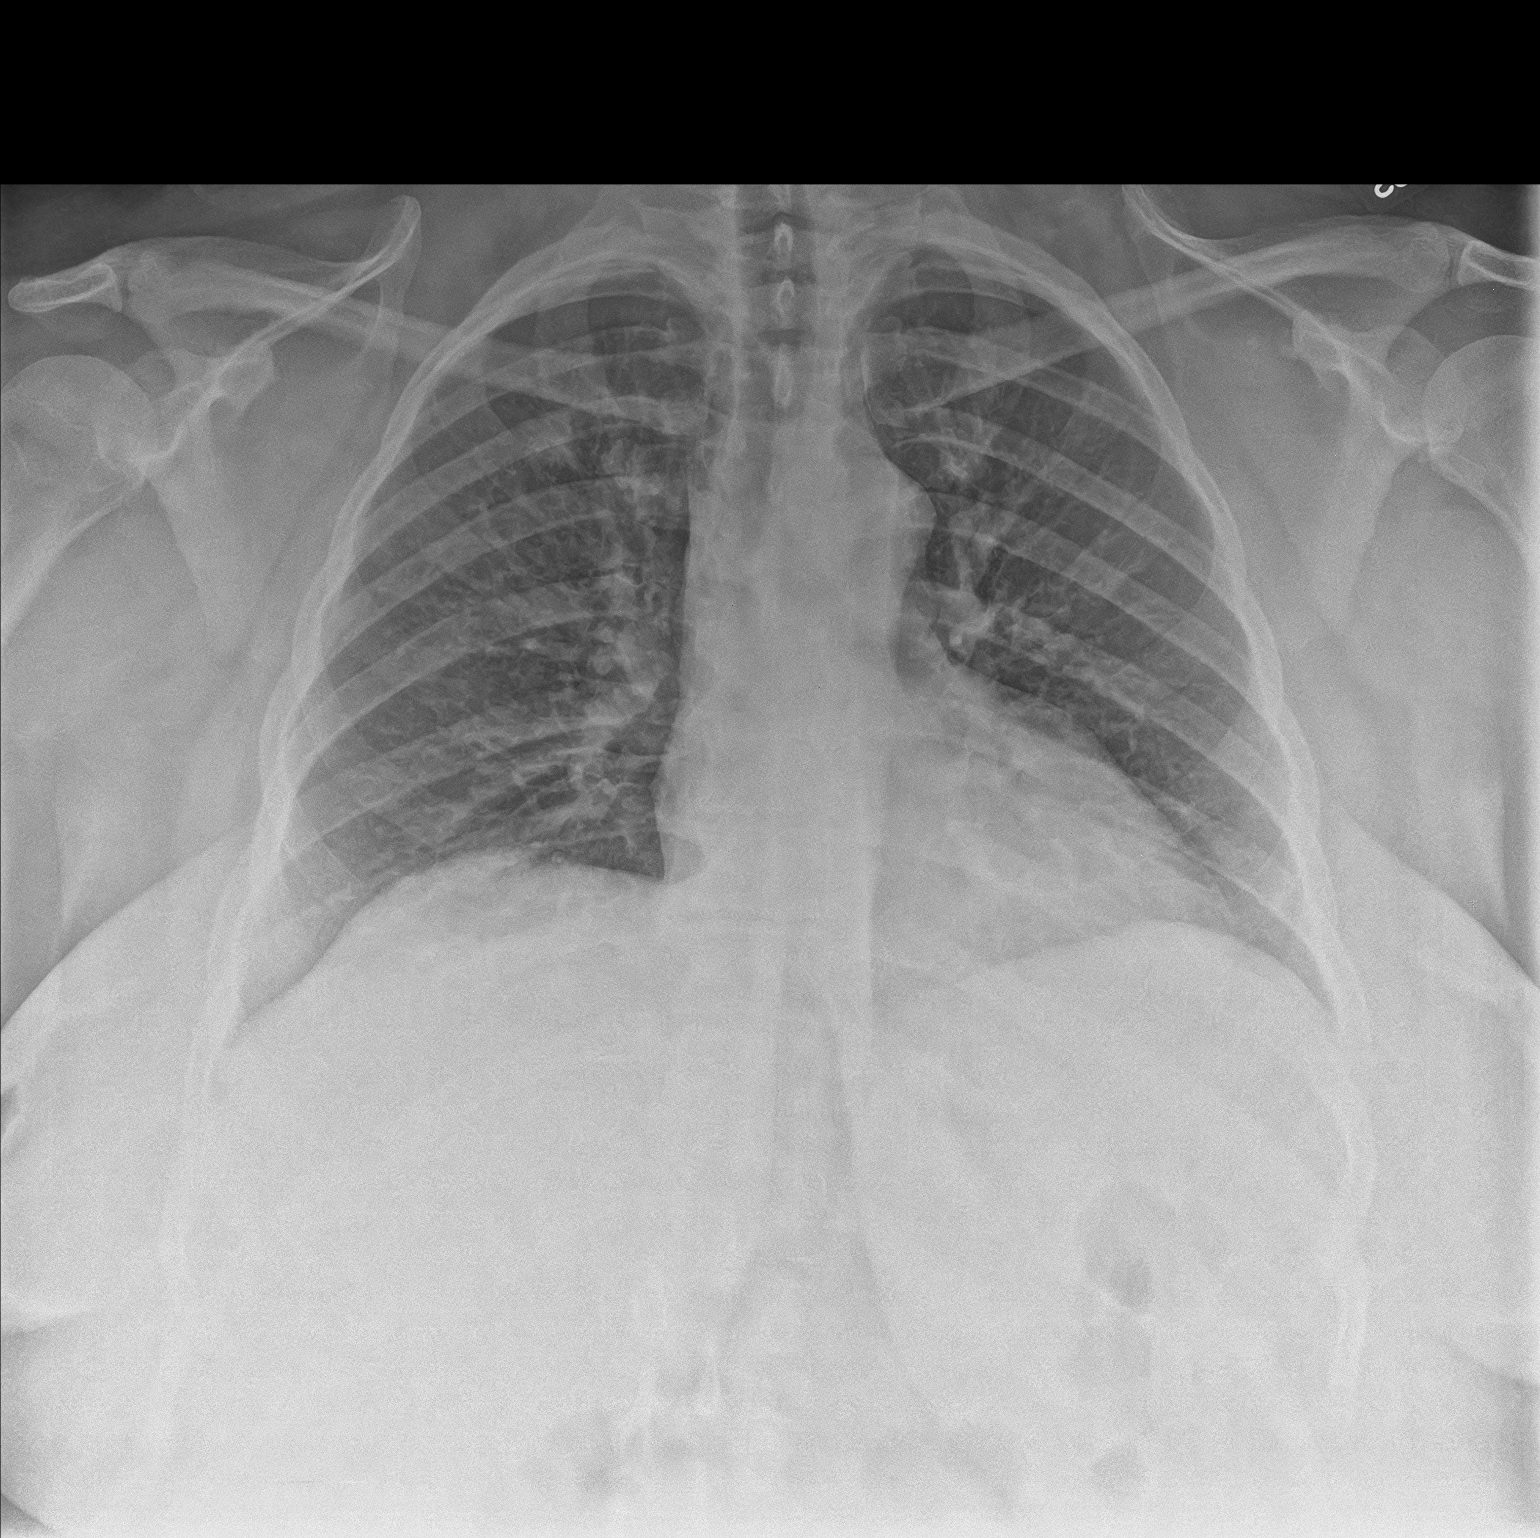

[chest lat]
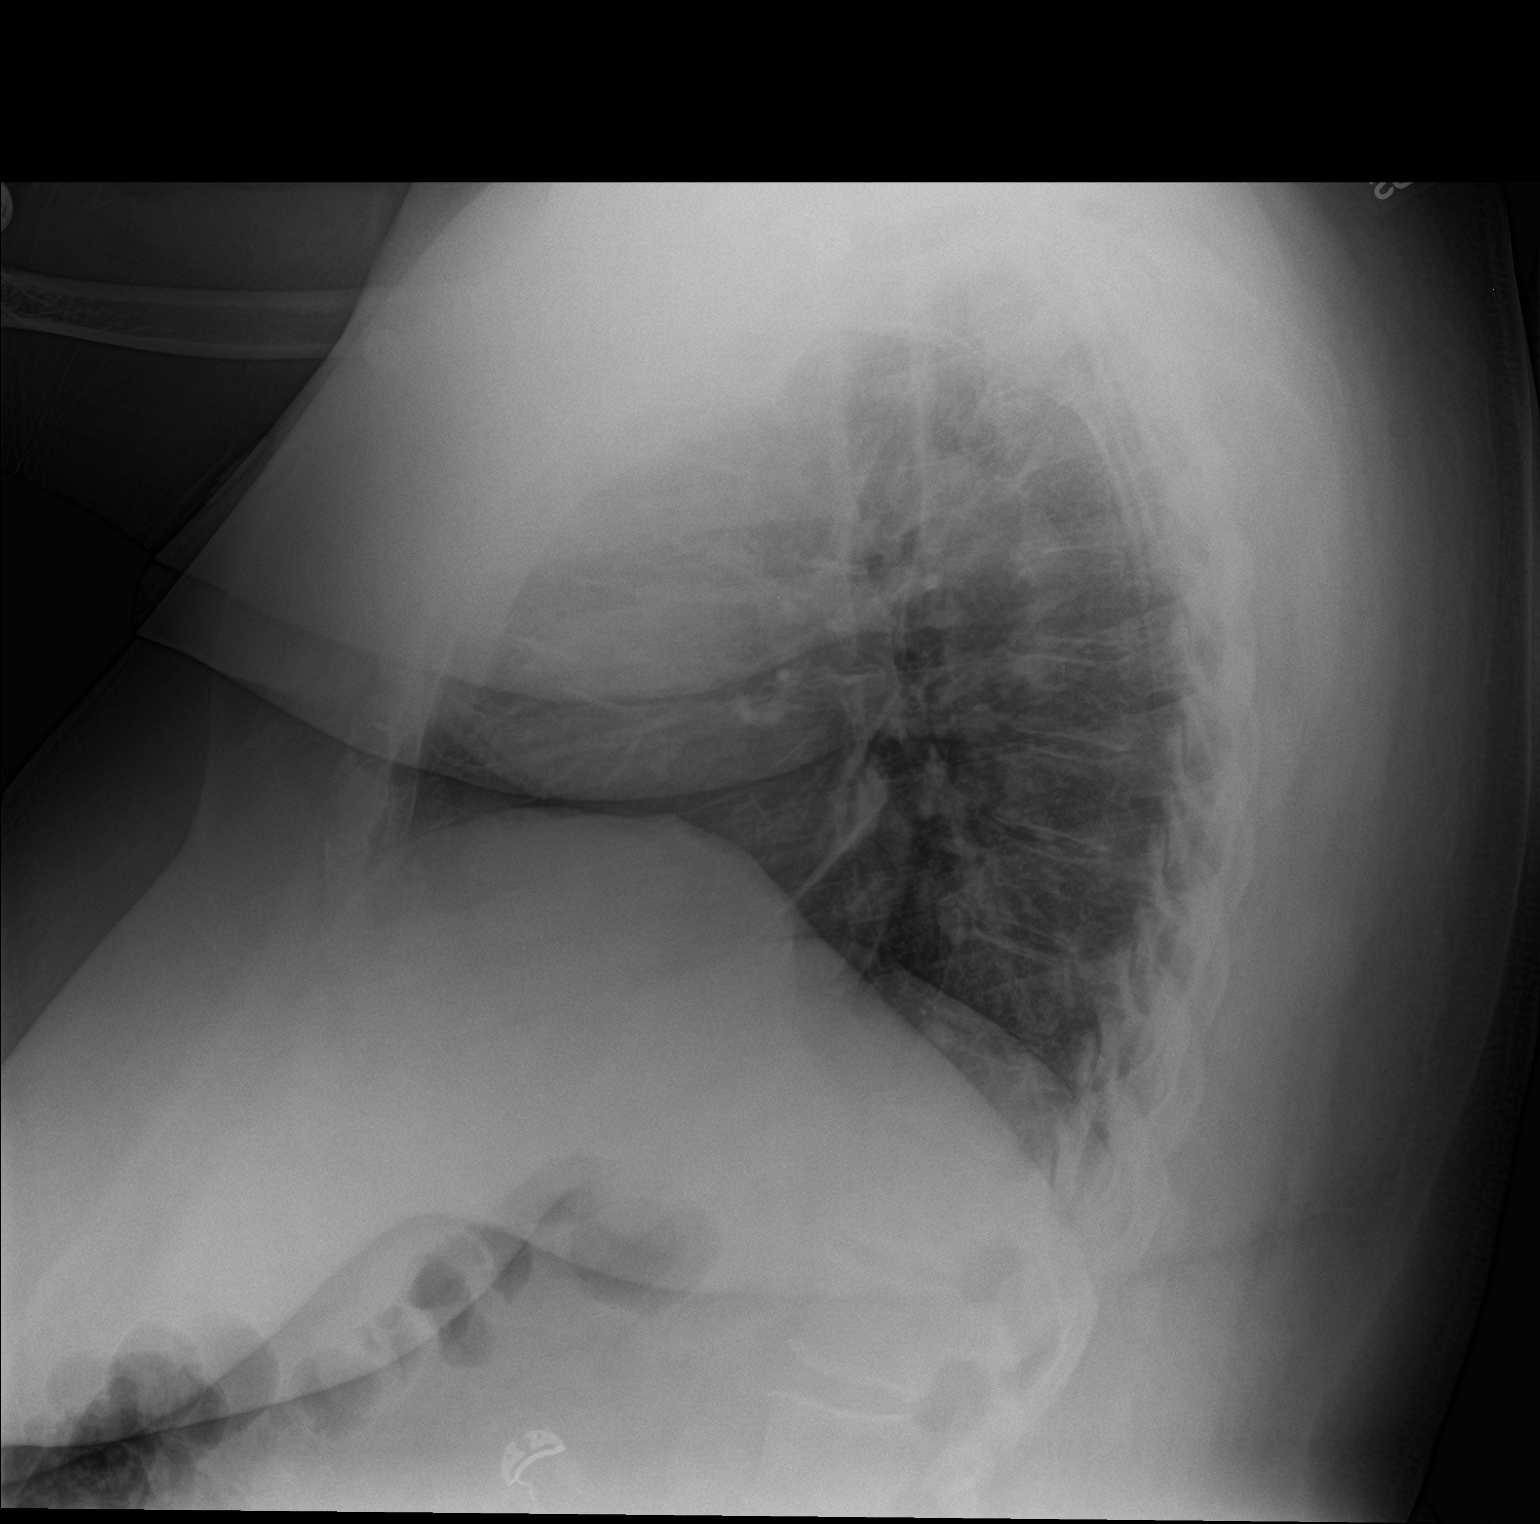

[2 of 2 positions shown; findings below may reference images not displayed]

FINDINGS: The cardiomediastinal silhouette is normal in size. Normal pulmonary
vascularity. Peribronchial thickening. No focal consolidation,
pleural effusion, or pneumothorax. No acute osseous abnormality.
IMPRESSION: Bronchitic changes.  No consolidation.

## 2018-10-24 IMAGING — US US TRANSVAGINAL NON-OB
1 series · 13 of 25 positions shown · non-contrast
Comparison: CT 07/25/2017

CLINICAL DATA: RIGHT lower quadrant pain abdominal pain. RIGHT
ovarian lesion identified on CT



[Series 1: us transvaginal non-ob · 0.25mm/px · 13 of 100 slices shown]
[im 1/100]
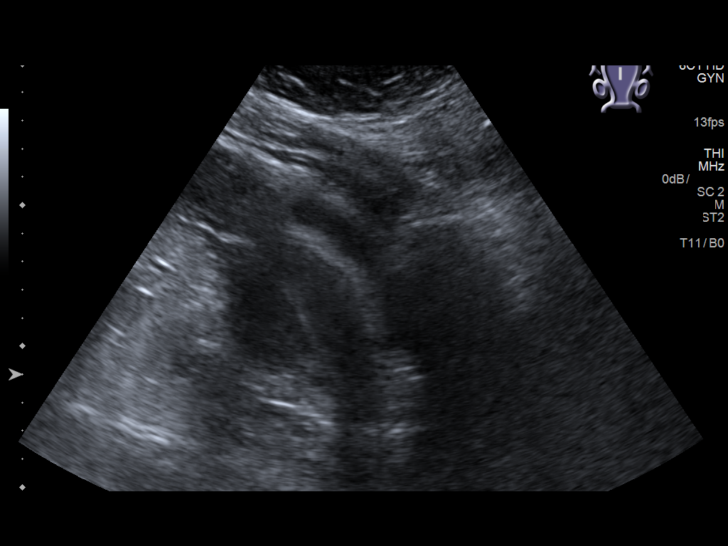
[im 9/100]
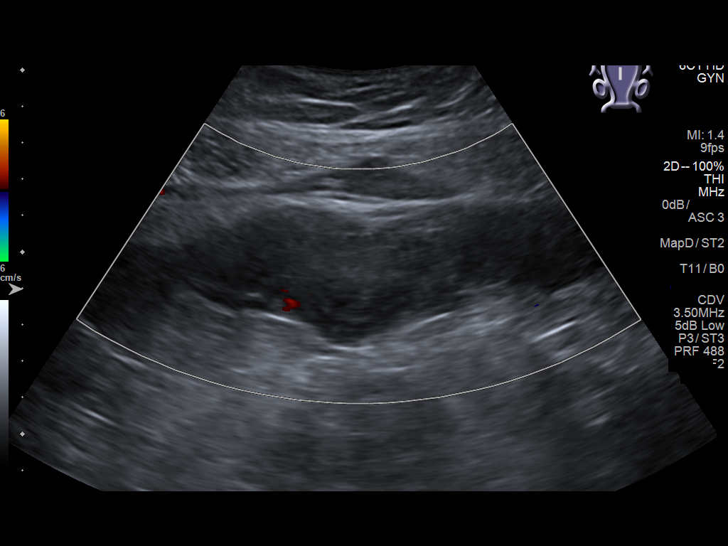
[im 17/100]
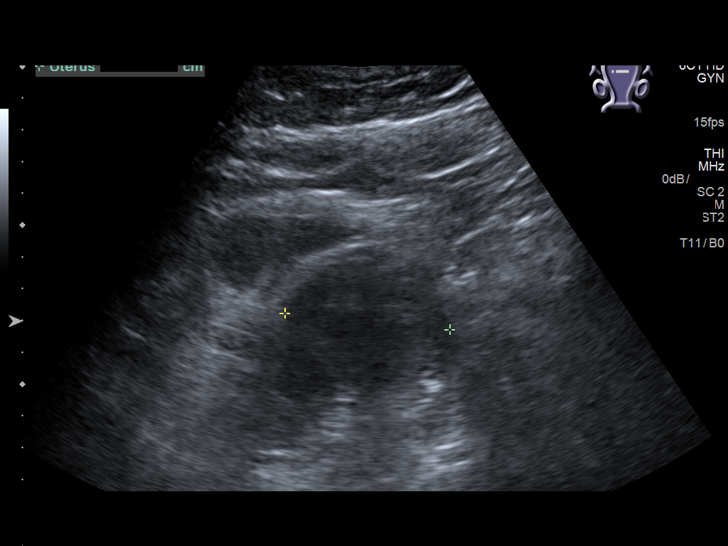
[im 25/100]
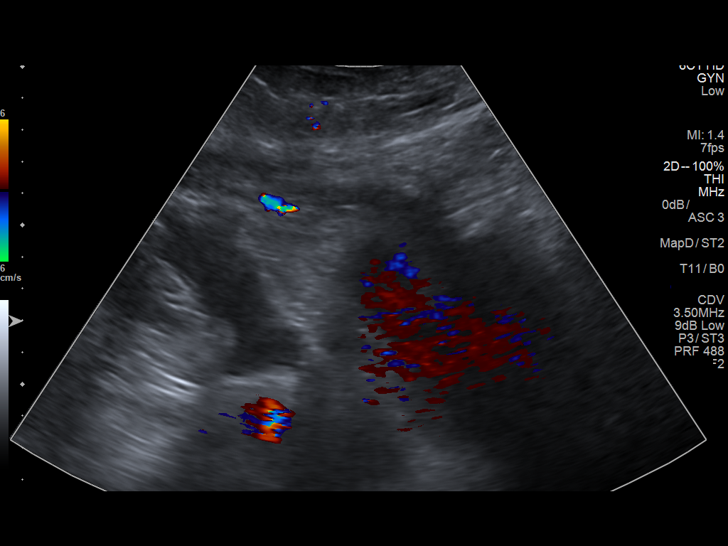
[im 34/100]
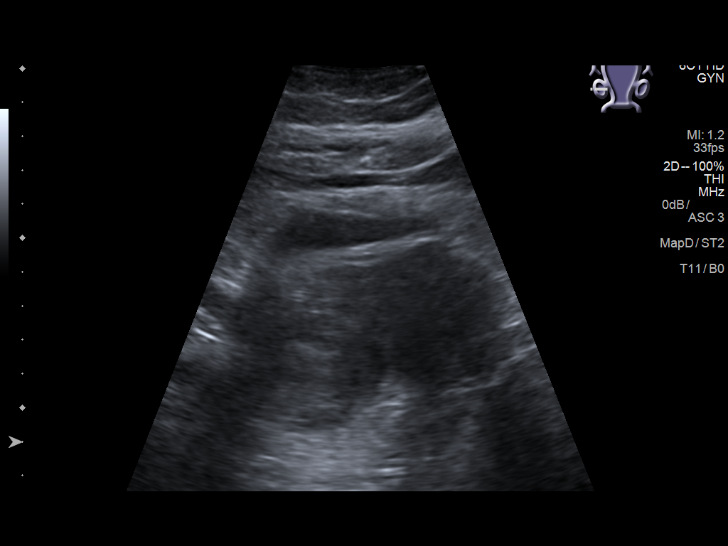
[im 42/100]
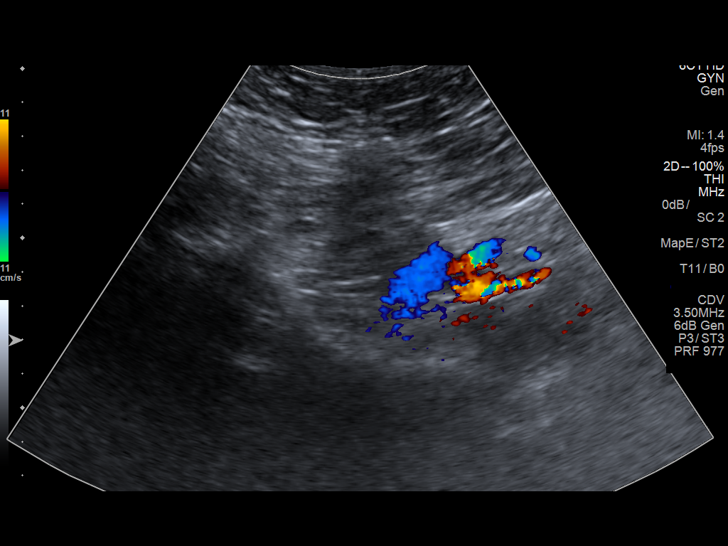
[im 50/100]
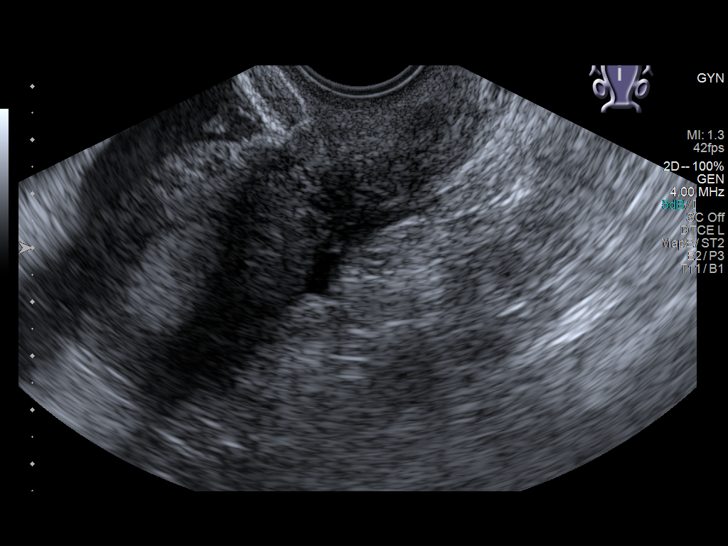
[im 58/100]
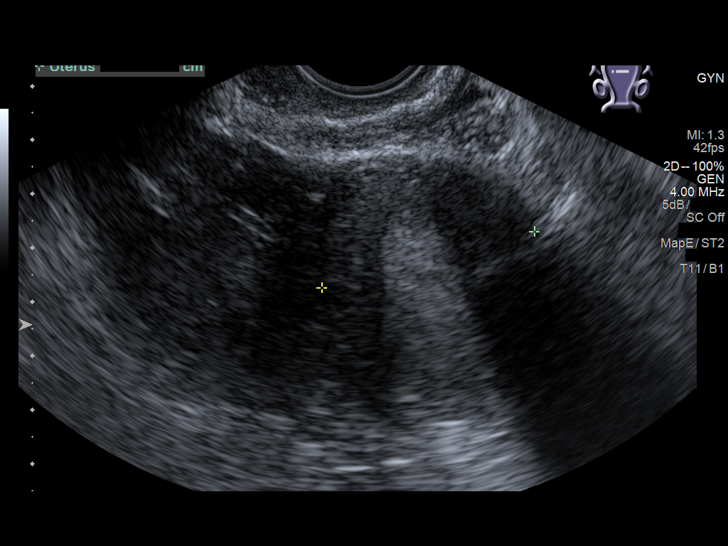
[im 67/100]
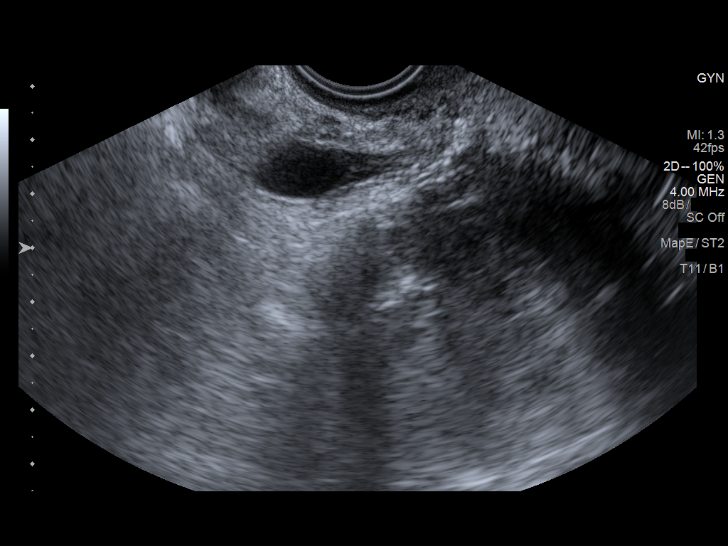
[im 75/100]
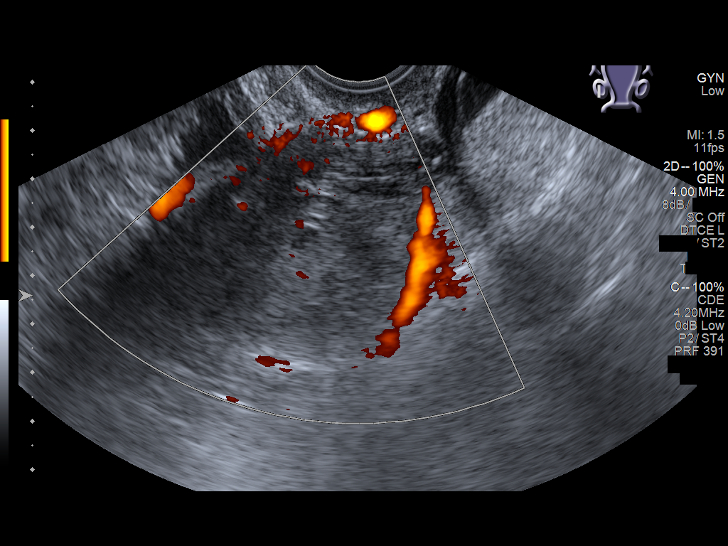
[im 83/100]
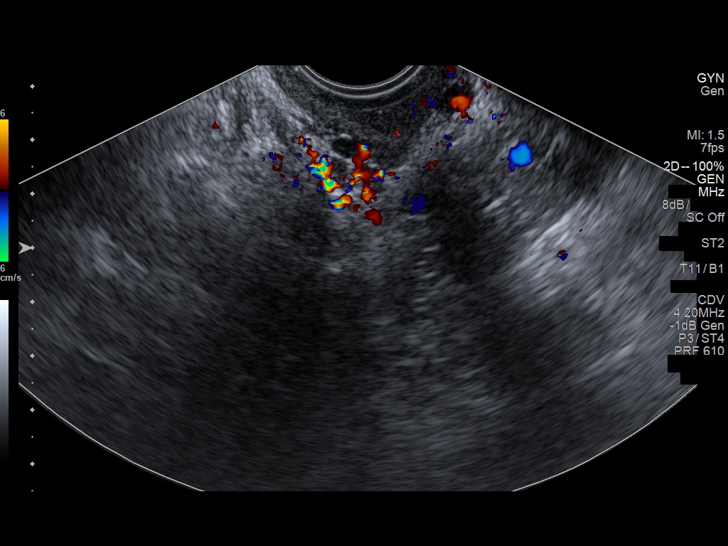
[im 91/100]
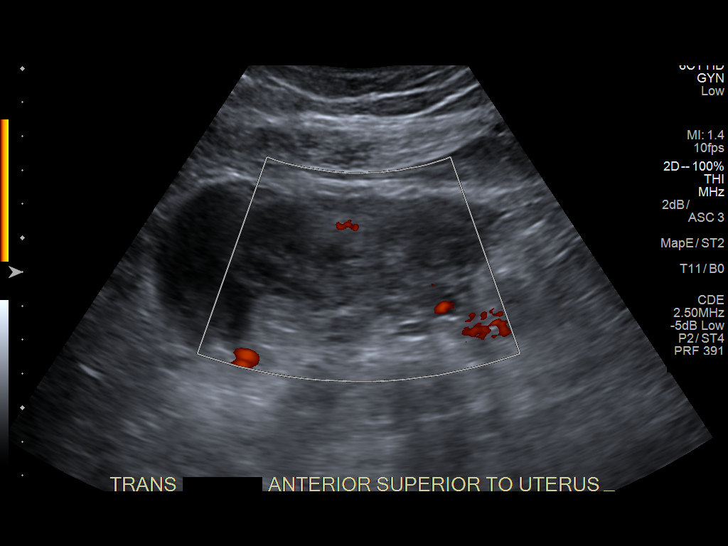
[im 100/100]
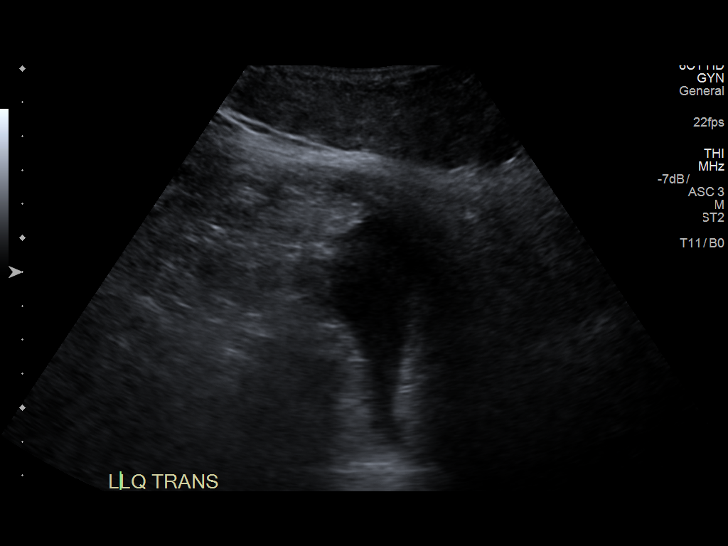

[13 of 25 positions shown; findings below may reference images not displayed]

FINDINGS: Uterus

Measurements: Normal in size at 8.9 x 8.3 x 7.6 cm.. No fibroids or
other mass visualized.

Endometrium

Thickness: Normal in thickness for premenopausal female at 8 mm.. No
focal abnormality visualized.

Right ovary

Measurements: Solid mass associated with the RIGHT ovary measuring
4.0 x 3.6 x 4.5 cm..

Left ovary

Measurements: Seen transabdominally measuring 3.4 x 2.7 by 1.7 cm..

Other findings

Superior and anterior to the uterus is elongated hypoechoic mass
measuring 15.5 x 3.6 x 5.1 cm.
IMPRESSION: 1. Irregular elongated mass superior to the uterus is concerning for
neoplasm potentially of ovarian origin.
2. RIGHT ovary is poorly demonstrated with an associated hypoechoic
mass measuring 4.5 cm.
3. LEFT ovary appears normal.  Uterus appears normal.
4. Small amount free fluid the pelvis.
5. Recommend non emergent GYN surgical consultation. Pelvic MRI with
contrast may provide better characterization of soft tissue findings

## 2020-03-12 ENCOUNTER — Other Ambulatory Visit: Payer: Self-pay

## 2020-03-12 ENCOUNTER — Ambulatory Visit: Payer: Self-pay | Attending: Internal Medicine

## 2020-03-12 DIAGNOSIS — Z23 Encounter for immunization: Secondary | ICD-10-CM

## 2020-03-12 NOTE — Progress Notes (Signed)
   Covid-19 Vaccination Clinic  Name:  HANNAHGRACE LALLI    MRN: [id number] DOB: [birth year]  03/12/2020  Ms. Mercer was observed post Covid-19 immunization for 15 minutes without incident. She was provided with Vaccine Information Sheet and instruction to access the V-Safe system.   Ms. Cherry was instructed to call 911 with any severe reactions post vaccine: Marland Kitchen Difficulty breathing  . Swelling of face and throat  . A fast heartbeat  . A bad rash all over body  . Dizziness and weakness   Immunizations Administered    Name Date Dose VIS Date Route   Pfizer COVID-19 Vaccine 03/12/2020 11:57 AM 0.3 mL 11/21/2019 Intramuscular   Manufacturer: [employer], [employer]   Lot: [id number]   NDC: [address]-4

## 2020-04-07 ENCOUNTER — Ambulatory Visit: Payer: Self-pay

## 2020-04-14 ENCOUNTER — Ambulatory Visit: Payer: Self-pay | Attending: Internal Medicine

## 2020-04-14 DIAGNOSIS — Z23 Encounter for immunization: Secondary | ICD-10-CM

## 2020-04-14 NOTE — Progress Notes (Signed)
   Covid-19 Vaccination Clinic  Name:  Sophia Brooks    MRN: [id number] DOB: [birth year]  04/14/2020  [name] was observed post Covid-19 immunization for 15 minutes without incident. She was provided with Vaccine Information Sheet and instruction to access the V-Safe system.   [name] was instructed to call 911 with any severe reactions post vaccine: Marland Kitchen Difficulty breathing  . Swelling of face and throat  . A fast heartbeat  . A bad rash all over body  . Dizziness and weakness   Immunizations Administered    Name Date Dose VIS Date Route   Pfizer COVID-19 Vaccine 04/14/2020  9:37 AM 0.3 mL 02/04/2019 Intramuscular   Manufacturer: [employer], [employer]   Lot: [id number]   NDC: [address]-0

## 2024-05-08 ENCOUNTER — Ambulatory Visit: Attending: Otolaryngology

## 2024-05-08 DIAGNOSIS — R0683 Snoring: Secondary | ICD-10-CM | POA: Diagnosis present

## 2024-05-08 DIAGNOSIS — G4733 Obstructive sleep apnea (adult) (pediatric): Secondary | ICD-10-CM | POA: Insufficient documentation
